# Patient Record
Sex: Male | Born: 1945
Health system: Southern US, Community
[De-identification: ages and names within clinical notes are randomized; demographics above are authoritative.]

## PROBLEM LIST (undated history)

## (undated) DIAGNOSIS — F1020 Alcohol dependence, uncomplicated: Secondary | ICD-10-CM

## (undated) DIAGNOSIS — M1990 Unspecified osteoarthritis, unspecified site: Secondary | ICD-10-CM

## (undated) DIAGNOSIS — Z5189 Encounter for other specified aftercare: Secondary | ICD-10-CM

## (undated) DIAGNOSIS — IMO0001 Reserved for inherently not codable concepts without codable children: Secondary | ICD-10-CM

## (undated) DIAGNOSIS — R011 Cardiac murmur, unspecified: Secondary | ICD-10-CM

## (undated) DIAGNOSIS — E785 Hyperlipidemia, unspecified: Secondary | ICD-10-CM

## (undated) DIAGNOSIS — G709 Myoneural disorder, unspecified: Secondary | ICD-10-CM

## (undated) DIAGNOSIS — T7840XA Allergy, unspecified, initial encounter: Secondary | ICD-10-CM

## (undated) DIAGNOSIS — K219 Gastro-esophageal reflux disease without esophagitis: Secondary | ICD-10-CM

## (undated) DIAGNOSIS — K269 Duodenal ulcer, unspecified as acute or chronic, without hemorrhage or perforation: Secondary | ICD-10-CM

## (undated) DIAGNOSIS — K5730 Diverticulosis of large intestine without perforation or abscess without bleeding: Secondary | ICD-10-CM

## (undated) HISTORY — DX: Allergy, unspecified, initial encounter: T78.40XA

## (undated) HISTORY — DX: Unspecified osteoarthritis, unspecified site: M19.90

## (undated) HISTORY — DX: Hyperlipidemia, unspecified: E78.5

## (undated) HISTORY — DX: Encounter for other specified aftercare: Z51.89

## (undated) HISTORY — PX: VASECTOMY: SHX75

## (undated) HISTORY — PX: SMALL INTESTINE SURGERY: SHX150

## (undated) HISTORY — DX: Gastro-esophageal reflux disease without esophagitis: K21.9

## (undated) HISTORY — PX: TONSILLECTOMY: SUR1361

## (undated) HISTORY — DX: Cardiac murmur, unspecified: R01.1

## (undated) HISTORY — DX: Myoneural disorder, unspecified: G70.9

## (undated) HISTORY — DX: Duodenal ulcer, unspecified as acute or chronic, without hemorrhage or perforation: K26.9

## (undated) HISTORY — PX: UPPER GASTROINTESTINAL ENDOSCOPY: SHX188

## (undated) HISTORY — PX: HERNIA REPAIR: SHX51

---

## 2005-04-14 DIAGNOSIS — K5730 Diverticulosis of large intestine without perforation or abscess without bleeding: Secondary | ICD-10-CM

## 2005-04-14 HISTORY — PX: COLONOSCOPY: SHX174

## 2005-04-14 HISTORY — DX: Diverticulosis of large intestine without perforation or abscess without bleeding: K57.30

## 2006-01-27 ENCOUNTER — Ambulatory Visit: Payer: Self-pay | Admitting: Gastroenterology

## 2006-02-06 ENCOUNTER — Ambulatory Visit: Payer: Self-pay | Admitting: Gastroenterology

## 2012-02-24 ENCOUNTER — Ambulatory Visit (INDEPENDENT_AMBULATORY_CARE_PROVIDER_SITE_OTHER): Payer: Medicare Other | Admitting: Family Medicine

## 2012-02-24 DIAGNOSIS — Z23 Encounter for immunization: Secondary | ICD-10-CM

## 2012-02-24 NOTE — Progress Notes (Signed)
  Subjective:    Patient ID: Christopher Hendricks, male    DOB: Jan 26, 1946, 66 y.o.   MRN: [id number]  HPI    Review of Systems     Objective:   Physical Exam Flu shot only       Assessment & Plan:

## 2012-08-05 ENCOUNTER — Encounter: Payer: Medicare Other | Admitting: Family Medicine

## 2012-08-17 ENCOUNTER — Encounter (HOSPITAL_COMMUNITY): Payer: Self-pay | Admitting: *Deleted

## 2012-08-17 ENCOUNTER — Inpatient Hospital Stay (HOSPITAL_COMMUNITY)
Admission: EM | Admit: 2012-08-17 | Discharge: 2012-08-20 | DRG: 378 | Disposition: A | Discharge status: Home / Self Care | Payer: Medicare Other | Attending: Internal Medicine | Admitting: Internal Medicine

## 2012-08-17 ENCOUNTER — Ambulatory Visit (INDEPENDENT_AMBULATORY_CARE_PROVIDER_SITE_OTHER): Payer: Medicare Other | Admitting: Family Medicine

## 2012-08-17 VITALS — BP 110/80 | HR 105 | Temp 98.3°F | Resp 16 | Ht 71.0 in | Wt 154.0 lb

## 2012-08-17 DIAGNOSIS — F1020 Alcohol dependence, uncomplicated: Secondary | ICD-10-CM | POA: Diagnosis present

## 2012-08-17 DIAGNOSIS — D62 Acute posthemorrhagic anemia: Secondary | ICD-10-CM | POA: Diagnosis present

## 2012-08-17 DIAGNOSIS — R7989 Other specified abnormal findings of blood chemistry: Secondary | ICD-10-CM | POA: Diagnosis present

## 2012-08-17 DIAGNOSIS — R55 Syncope and collapse: Secondary | ICD-10-CM

## 2012-08-17 DIAGNOSIS — K2920 Alcoholic gastritis without bleeding: Secondary | ICD-10-CM | POA: Diagnosis present

## 2012-08-17 DIAGNOSIS — K922 Gastrointestinal hemorrhage, unspecified: Secondary | ICD-10-CM | POA: Diagnosis present

## 2012-08-17 DIAGNOSIS — D649 Anemia, unspecified: Secondary | ICD-10-CM

## 2012-08-17 DIAGNOSIS — R0602 Shortness of breath: Secondary | ICD-10-CM

## 2012-08-17 DIAGNOSIS — H539 Unspecified visual disturbance: Secondary | ICD-10-CM

## 2012-08-17 DIAGNOSIS — E860 Dehydration: Secondary | ICD-10-CM

## 2012-08-17 DIAGNOSIS — I951 Orthostatic hypotension: Secondary | ICD-10-CM | POA: Diagnosis present

## 2012-08-17 DIAGNOSIS — Z79899 Other long term (current) drug therapy: Secondary | ICD-10-CM

## 2012-08-17 DIAGNOSIS — Z7982 Long term (current) use of aspirin: Secondary | ICD-10-CM

## 2012-08-17 DIAGNOSIS — R110 Nausea: Secondary | ICD-10-CM

## 2012-08-17 DIAGNOSIS — F1010 Alcohol abuse, uncomplicated: Secondary | ICD-10-CM | POA: Diagnosis present

## 2012-08-17 DIAGNOSIS — K263 Acute duodenal ulcer without hemorrhage or perforation: Secondary | ICD-10-CM | POA: Diagnosis present

## 2012-08-17 DIAGNOSIS — K264 Chronic or unspecified duodenal ulcer with hemorrhage: Principal | ICD-10-CM | POA: Diagnosis present

## 2012-08-17 DIAGNOSIS — Z8719 Personal history of other diseases of the digestive system: Secondary | ICD-10-CM

## 2012-08-17 DIAGNOSIS — R0789 Other chest pain: Secondary | ICD-10-CM

## 2012-08-17 DIAGNOSIS — F10229 Alcohol dependence with intoxication, unspecified: Secondary | ICD-10-CM | POA: Diagnosis present

## 2012-08-17 HISTORY — DX: Reserved for inherently not codable concepts without codable children: IMO0001

## 2012-08-17 HISTORY — DX: Diverticulosis of large intestine without perforation or abscess without bleeding: K57.30

## 2012-08-17 HISTORY — DX: Alcohol dependence, uncomplicated: F10.20

## 2012-08-17 LAB — POCT CBC
HCT, POC: 34 % — AB (ref 43.5–53.7)
Lymph, poc: 1.9 (ref 0.6–3.4)
MCHC: 32.1 g/dL (ref 31.8–35.4)
MPV: 11.4 fL (ref 0–99.8)
POC Granulocyte: 5.7 (ref 2–6.9)
POC LYMPH PERCENT: 23.7 %L (ref 10–50)
POC MID %: 6.5 %M (ref 0–12)
RDW, POC: 13.1 %

## 2012-08-17 LAB — PROTIME-INR
INR: 1.07 (ref 0.00–1.49)
Prothrombin Time: 13.8 seconds (ref 11.6–15.2)

## 2012-08-17 LAB — BASIC METABOLIC PANEL
Calcium: 8.5 mg/dL (ref 8.4–10.5)
GFR calc non Af Amer: 90 mL/min — ABNORMAL LOW (ref >90)
Sodium: 136 mEq/L (ref 135–145)

## 2012-08-17 LAB — POCT I-STAT TROPONIN I

## 2012-08-17 LAB — CBC
Hemoglobin: 8.9 g/dL — ABNORMAL LOW (ref 13.0–17.0)
MCHC: 35.6 g/dL (ref 30.0–36.0)
WBC: 5.3 10*3/uL (ref 4.0–10.5)

## 2012-08-17 LAB — GLUCOSE, CAPILLARY: Glucose-Capillary: 98 mg/dL (ref 70–99)

## 2012-08-17 MED ORDER — THIAMINE HCL 100 MG/ML IJ SOLN
100.0000 mg | Freq: Every day | INTRAMUSCULAR | Status: DC
  Filled 2012-08-17 (×3): qty 1

## 2012-08-17 MED ORDER — BIOTENE DRY MOUTH MT LIQD
15.0000 mL | Freq: Two times a day (BID) | OROMUCOSAL | Status: DC
Start: 2012-08-18 — End: 2012-08-20

## 2012-08-17 MED ORDER — PNEUMOCOCCAL VAC POLYVALENT 25 MCG/0.5ML IJ INJ
0.5000 mL | INJECTION | INTRAMUSCULAR | Status: AC
  Administered 2012-08-18: 0.5 mL via INTRAMUSCULAR
  Filled 2012-08-17: qty 0.5

## 2012-08-17 MED ORDER — OCTREOTIDE LOAD VIA INFUSION: 50.0000 ug | Freq: Once | INTRAVENOUS | Status: DC

## 2012-08-17 MED ORDER — SODIUM CHLORIDE 0.9 % IV SOLN
8.0000 mg/h | INTRAVENOUS | Status: DC
  Administered 2012-08-17 – 2012-08-20 (×4): 8 mg/h via INTRAVENOUS
  Filled 2012-08-17 (×12): qty 80

## 2012-08-17 MED ORDER — ONDANSETRON HCL 4 MG PO TABS: 4.0000 mg | ORAL_TABLET | Freq: Four times a day (QID) | ORAL | Status: DC | PRN

## 2012-08-17 MED ORDER — ACETAMINOPHEN 650 MG RE SUPP: 650.0000 mg | Freq: Four times a day (QID) | RECTAL | Status: DC | PRN

## 2012-08-17 MED ORDER — SODIUM CHLORIDE 0.9 % IV SOLN
INTRAVENOUS | Status: DC
  Administered 2012-08-17 – 2012-08-18 (×2): via INTRAVENOUS

## 2012-08-17 MED ORDER — VITAMIN B-1 100 MG PO TABS
100.0000 mg | ORAL_TABLET | Freq: Every day | ORAL | Status: DC
  Administered 2012-08-18 – 2012-08-20 (×3): 100 mg via ORAL
  Filled 2012-08-17 (×3): qty 1

## 2012-08-17 MED ORDER — PANTOPRAZOLE SODIUM 40 MG IV SOLR
40.0000 mg | Freq: Once | INTRAVENOUS | Status: AC
  Administered 2012-08-17: 40 mg via INTRAVENOUS
  Filled 2012-08-17: qty 40

## 2012-08-17 MED ORDER — LORAZEPAM 1 MG PO TABS: 1.0000 mg | ORAL_TABLET | Freq: Four times a day (QID) | ORAL | Status: DC | PRN

## 2012-08-17 MED ORDER — FOLIC ACID 1 MG PO TABS
1.0000 mg | ORAL_TABLET | Freq: Every day | ORAL | Status: DC
  Administered 2012-08-18 – 2012-08-20 (×3): 1 mg via ORAL
  Filled 2012-08-17 (×3): qty 1

## 2012-08-17 MED ORDER — SODIUM CHLORIDE 0.9 % IJ SOLN
3.0000 mL | Freq: Two times a day (BID) | INTRAMUSCULAR | Status: DC
  Administered 2012-08-17: 3 mL via INTRAVENOUS

## 2012-08-17 MED ORDER — ACETAMINOPHEN 325 MG PO TABS: 650.0000 mg | ORAL_TABLET | Freq: Four times a day (QID) | ORAL | Status: DC | PRN

## 2012-08-17 MED ORDER — CHLORHEXIDINE GLUCONATE 0.12 % MT SOLN
15.0000 mL | Freq: Two times a day (BID) | OROMUCOSAL | Status: DC
  Administered 2012-08-18: 15 mL via OROMUCOSAL
  Filled 2012-08-17 (×8): qty 15

## 2012-08-17 MED ORDER — SODIUM CHLORIDE 0.9 % IV BOLUS (SEPSIS)
1000.0000 mL | Freq: Once | INTRAVENOUS | Status: AC
  Administered 2012-08-17: 1000 mL via INTRAVENOUS

## 2012-08-17 MED ORDER — LORAZEPAM 2 MG/ML IJ SOLN: 0.0000 mg | Freq: Two times a day (BID) | INTRAMUSCULAR | Status: DC

## 2012-08-17 MED ORDER — OCTREOTIDE ACETATE 50 MCG/ML IJ SOLN
50.0000 ug | Freq: Once | INTRAMUSCULAR | Status: AC
  Administered 2012-08-17: 50 ug via SUBCUTANEOUS
  Filled 2012-08-17: qty 1

## 2012-08-17 MED ORDER — ONDANSETRON HCL 4 MG/2ML IJ SOLN: 4.0000 mg | Freq: Four times a day (QID) | INTRAMUSCULAR | Status: DC | PRN

## 2012-08-17 MED ORDER — LORAZEPAM 2 MG/ML IJ SOLN: 1.0000 mg | Freq: Four times a day (QID) | INTRAMUSCULAR | Status: DC | PRN

## 2012-08-17 MED ORDER — ADULT MULTIVITAMIN W/MINERALS CH
1.0000 | ORAL_TABLET | Freq: Every day | ORAL | Status: DC
  Administered 2012-08-18 – 2012-08-20 (×3): 1 via ORAL
  Filled 2012-08-17 (×3): qty 1

## 2012-08-17 MED ORDER — LORAZEPAM 2 MG/ML IJ SOLN: 0.0000 mg | Freq: Four times a day (QID) | INTRAMUSCULAR | Status: AC

## 2012-08-17 NOTE — ED Provider Notes (Signed)
History     CSN: [id number]  Arrival date & time 08/17/12  1729   First MD Initiated Contact with Patient 08/17/12 1738      Chief Complaint  Patient presents with  . Near Syncope     Patient is a 67 y.o. male presenting with weakness. The history is provided by the patient.  Weakness This is a new problem. The current episode started 2 days ago. The problem occurs daily. The problem has been resolved. Associated symptoms include chest pain and shortness of breath. Pertinent negatives include no abdominal pain and no headaches. Exacerbated by: activity. The symptoms are relieved by rest. He has tried rest for the symptoms. The treatment provided moderate relief.  Pt reports two episodes of generalized weakness over the past 2 days He reports today he was walking and felt weak, lightheaded and had to rest.  He reports mild CP and SOB that improves with rest No fever/HA/vomitng.  No abdominal pain.  He did not fall or hit his head He also reports recent dark stool as well He denies known h/o ulcers He reports h/o daily etoh use  History reviewed. No pertinent past medical history.  Past Surgical History  Procedure Laterality Date  . Hernia repair      Family History  Problem Relation Age of Onset  . Diabetes Daughter     History  Substance Use Topics  . Smoking status: Never Smoker   . Smokeless tobacco: Not on file  . Alcohol Use: Yes      Review of Systems  Constitutional: Positive for fatigue. Negative for fever.  Respiratory: Positive for shortness of breath.   Cardiovascular: Positive for chest pain.  Gastrointestinal: Positive for blood in stool. Negative for abdominal pain.  Neurological: Positive for weakness. Negative for headaches.  All other systems reviewed and are negative.    Allergies  Codeine and Sulfa antibiotics  Home Medications   Current Outpatient Rx  Name  Route  Sig  Dispense  Refill  . Biotin 5 MG CAPS   Oral   Take 1 capsule by  mouth every other day.          . Multiple Vitamins-Minerals (MULTIVITAMIN WITH MINERALS) tablet   Oral   Take 1 tablet by mouth daily.         . saw palmetto 160 MG capsule   Oral   Take 320 mg by mouth daily.          . Zinc Sulfate (ZINC 15 PO)   Oral   Take 1 capsule by mouth every other day.            BP 124/70  Pulse 101  Temp(Src) 98.7 F (37.1 C) (Oral)  SpO2 99%  Physical Exam CONSTITUTIONAL: Well developed/well nourished HEAD: Normocephalic/atraumatic EYES: EOMI/PERRL ENMT: Mucous membranes moist NECK: supple no meningeal signs SPINE:entire spine nontender CV: S1/S2 noted, no murmurs/rubs/gallops noted LUNGS: Lungs are clear to auscultation bilaterally, no apparent distress ABDOMEN: soft, nontender, no rebound or guarding GU:no cva tenderness Rectal - stool color black, hemoccult positive (pt refused to have nurse chaperone) NEURO: Pt is awake/alert, moves all extremitiesx4 EXTREMITIES: pulses normal, full ROM SKIN: warm, color normal PSYCH: no abnormalities of mood noted  ED Course  Procedures   Labs Reviewed  BASIC METABOLIC PANEL  TYPE AND SCREEN   Pt with near syncopal episodes He is anemic when compared to prior labs (last HGB around 15 per urgent care) He has melena Will admit Pt agreeable  9:15 PM D/w [facility] dr Toniann Fail will admit for GI bleed   MDM  Nursing notes including past medical history and social history reviewed and considered in documentation Labs/vital reviewed and considered Previous records reviewed and considered    Date: 08/17/2012  Rate: 83  Rhythm: normal sinus rhythm  QRS Axis: normal  Intervals: normal  ST/T Wave abnormalities: nonspecific ST changes  Conduction Disutrbances:none         Joya Gaskins, MD 08/17/12 2116

## 2012-08-17 NOTE — ED Notes (Signed)
Reports had a near syncopal episode yesterday when got up to go to bathroom. States felt some chest pressure, SOB, nausea, dizziness, lighheadedness, generalized weakness which lasted approx few minutes. Pt reports today had another similar episode while walking. Denies all symptoms presently. Pt also reports noticed dark stool just today.

## 2012-08-17 NOTE — Progress Notes (Signed)
Subjective:    Patient ID: Christopher Hendricks, male    DOB: 01/01/1946, 67 y.o.   MRN: [id number]  HPI Christopher Hendricks is a 67 y.o. 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 Felt tired only - but usual with this activity.  Drinks about a liter of wine per night. Early yesterday morning. woke up feeling hot, sweating - too much covers, slight sweaty.  after urinating around 2:30am - vision went yellow, lightheaded, no collapse, but went to hands and knees. Felt a little better after 30 seconds. Not feeling well past few days - generalized discomfort- front off chest and abdomen. Walking to shoe shop today around 11:45am - out of breath, vision went yellow, slight tightness in chest, no radiation to neck, arms - lasted 15-30 seconds. Stopped walking and vision improved, slight nausea, no vomiting and not sweating with symptoms today. Still queasy feeling, but no further chest pain. No PND, no orthopnea. Slight queasy feeling since working this past weekend.   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  Nonsmoker.  Alcohol - about 6-8 glasses wine per day on average.  No recent change in amounts. No hx of DUI, or problems with alcohol, no known hx of withdrawal.   No known hx of HTN.  LDL 108 12/06/09. EKG 12/06/09 - Sr, no acute findings  FH: adopted - unknown FH of heart dz.   Takes 4 full strength aspirin every night.     Review of Systems  Constitutional: Positive for diaphoresis. Negative for fever and chills.  Eyes: Positive for visual disturbance.  Respiratory: Positive for chest tightness and shortness of breath.   Cardiovascular: Positive for chest pain. Negative for palpitations and leg swelling.  Gastrointestinal: Negative for abdominal pain and abdominal distention.  Neurological: Positive for weakness. Negative for dizziness, tremors, syncope, facial asymmetry, speech difficulty and light-headedness (episodes as above. ).        Objective:   Physical Exam  Vitals reviewed. Constitutional: He is oriented to person, place, and time. He appears well-developed and well-nourished. No distress.  HENT:  Head: Normocephalic and atraumatic.  Eyes: EOM are normal. Pupils are equal, round, and reactive to light.  Neck: No JVD present. Carotid bruit is not present.  Cardiovascular: Normal rate, regular rhythm and normal pulses.   No extrasystoles are present. PMI is not displaced.   Murmur heard.  Systolic murmur is present with a grade of 2/6  Hx of rheumatic fever by hx with chronic heart murmur.   Pulmonary/Chest: Effort normal and breath sounds normal. No respiratory distress. He has no wheezes. He has no rales.  Musculoskeletal: He exhibits no edema.  Neurological: He is alert and oriented to person, place, and time. He has normal strength. No sensory deficit. GCS eye subscore is 4. GCS verbal subscore is 5. GCS motor subscore is 6.  Nonfocal.   Skin: Skin is warm and dry. He is not diaphoretic.  Psychiatric: He has a normal mood and affect. His behavior is normal.    EKG: SR, no acute findings or apparent change from 12/06/09.  Results for orders placed in visit on 08/17/12  POCT CBC      Result Value Range   WBC 8.1  4.6 - 10.2 K/uL   Lymph, poc 1.9  0.6 - 3.4   POC LYMPH PERCENT 23.7  10 - 50 %L   MID (cbc) 0.5  0 - 0.9  POC MID % 6.5  0 - 12 %M   POC Granulocyte 5.7  2 - 6.9   Granulocyte percent 69.8  37 - 80 %G   RBC 3.53 (*) 4.69 - 6.13 M/uL   Hemoglobin 10.9 (*) 14.1 - 18.1 g/dL   HCT, POC 78.2 (*) 95.6 - 53.7 %   MCV 96.4  80 - 97 fL   MCH, POC 30.9  27 - 31.2 pg   MCHC 32.1  31.8 - 35.4 g/dL   RDW, POC 21.3     Platelet Count, POC 250  142 - 424 K/uL   MPV 11.4  0 - 99.8 fL       Assessment & Plan:  Christopher Hendricks is a 67 y.o. male Chest tightness - Plan: EKG 12-Lead  SOB (shortness of breath) - Plan: EKG 12-Lead  Nausea alone - Plan: EKG 12-Lead  Vision changes - Plan: EKG  12-Lead  Hx of near syncopal episode early yesterday morning with vision changes,  slight diaphoresis at the time, and general feeling of malaise.  Repeat episode with walking today of dyspnea, anterior chest tightness - resolved in 30 seconds with rest, but persistent generalized malaise, and nausea feeling now. Hx of alcohol use/overuse, but no recent increase. Risk factors for CAD of age, borderline hyperlipidemia on prior labs in 2011.  Unknown family hx as adopted. No acute findings on EKG, but ddx includes unstable angina, or ACS/non Qwave.  IV placed - 20 ga L forearm, NS KVO. , ems called for transport, transfer of care at 1653. Charge nurse advised.    Patient Instructions  We will have you evaluated in the emergency room for your symptoms tonight, and if discharged home - can return here for follow up.  - pt sent by EMS.     After end of visit - CBC results from blood obtained at placement of IV. Anemia noted at HGB 10.9. This is decreased from 15.3 in 12/06/09.  This was called to EDP at 1735 as patient in ER at this point.

## 2012-08-17 NOTE — ED Notes (Signed)
From [facility] - c/o near syncopal episode Monday morning & again today. Associated with CP, SOB, nausea, weakness. Presently denies all symptoms.

## 2012-08-17 NOTE — H&P (Signed)
[facility] History and Physical  AMONI MORALES ZOX:[id number] DOB: 1946-04-02 DOA: 08/17/2012  Referring physician: Dr. Bebe Shaggy. PCP: No primary provider on file. [facility].  Chief Complaint: Dizziness.  HPI: Christopher Hendricks is a 67 y.o. male with no significant past medical history was referred to the ER because patient was complaining of dizziness and near syncopal episodes. Patient had initial episode 2 days ago when he woke up in the morning 2 AM and felt like almost passing out. He was in the bathroom and sat down for some time and went back to the bed. A similar episode today when he was walking uphill. After he took some rest he felt better. Patient also had some exertional shortness of breath. Due to these symptoms he had gone to the urgent care. Over there blood work showed proper hemoglobin of 5 g from previous in their records. Patient also has been noticing dark stools last few days. In the ER patient's rectal exam by the ER physician showed black stools and repeat hemoglobin confirmed be around 10. At this time patient has been admitted for further management. Patient denies any nausea vomiting abdominal pain or diarrhea. Denies any chest pain presently. Patient has been taking aspirin every night. Denies using any NSAIDs otherwise. Has not noticed any frank bleeding. Patient is presently hemodynamically stable. Patient drinks alcohol everyday.  Review of Systems: As presented in the history of presenting illness, rest negative.  Past Medical History  Diagnosis Date  . Medical history non-contributory    Past Surgical History  Procedure Laterality Date  . Hernia repair     Social History:  reports that he has never smoked. He does not have any smokeless tobacco history on file. He reports that  drinks alcohol. He reports that he does not use illicit drugs. Lives at home. where does patient live-- Can do ADLs. Can patient participate in ADLs?  Allergies   Allergen Reactions  . Codeine Nausea And Vomiting  . Sulfa Antibiotics Rash    Family History  Problem Relation Age of Onset  . Diabetes Daughter       Prior to Admission medications   Medication Sig Start Date End Date Taking? Authorizing Provider  Biotin 5 MG CAPS Take 1 capsule by mouth every other day.    Yes Historical Provider, MD  Multiple Vitamins-Minerals (MULTIVITAMIN WITH MINERALS) tablet Take 1 tablet by mouth daily.   Yes Historical Provider, MD  saw palmetto 160 MG capsule Take 320 mg by mouth daily.    Yes Historical Provider, MD  Zinc Sulfate (ZINC 15 PO) Take 1 capsule by mouth every other day.    Yes Historical Provider, MD   Physical Exam: Filed Vitals:   08/17/12 1754 08/17/12 1755 08/17/12 1759 08/17/12 1845  BP: 127/75 120/74 124/70 123/76  Pulse: 78 86 101 80  Temp:      TempSrc:      Resp:    16  SpO2:    100%     General:  Well-developed and nourished.  Eyes: Mild pallor no icterus.  ENT: No discharge from the ears eyes nose and mouth.  Neck: No mass felt.  Cardiovascular: S1-S2 heard.  Respiratory: No rhonchi or crepitations.  Abdomen: Soft nontender bowel sounds present.  Skin: No rash.  Musculoskeletal: No edema.  Psychiatric: Appears normal.  Neurologic: Alert awake oriented to time place and person. Moves all extremities.  Labs on Admission:  Basic Metabolic Panel:  Recent Labs Lab 08/17/12 1800  NA  136  K 3.5  CL 101  CO2 28  GLUCOSE 106*  BUN 28*  CREATININE 0.84  CALCIUM 8.5   Liver Function Tests: No results found for this basename: AST, ALT, ALKPHOS, BILITOT, PROT, ALBUMIN,  in the last 168 hours No results found for this basename: LIPASE, AMYLASE,  in the last 168 hours No results found for this basename: AMMONIA,  in the last 168 hours CBC:  Recent Labs Lab 08/17/12 1727  WBC 8.1  HGB 10.9*  HCT 34.0*  MCV 96.4   Cardiac Enzymes: No results found for this basename: CKTOTAL, CKMB, CKMBINDEX,  TROPONINI,  in the last 168 hours  BNP (last 3 results) No results found for this basename: PROBNP,  in the last 8760 hours CBG: No results found for this basename: GLUCAP,  in the last 168 hours  Radiological Exams on Admission: No results found.  EKG: Independently reviewed. Normal sinus rhythm.  Assessment/Plan Active Problems:   GI bleed   Anemia   Alcoholism   1. GI bleed - suspect upper GI bleed. Since patient drinks alcohol at this time in addition to Protonix infusion we will also place patient on octreotide infusion. Check CBC frequently and transfuse if it falls less than 7 g or if patient becomes hypotensive. Patient will be kept n.p.o. in anticipation of possible EGD. Consult GI. Check INR and LFTs. 2. Blood loss anemia - closely follow CBC. 3. Alcoholism - patient has been placed on alcohol withdrawal protocol.    Code Status: Full code.  Family Communication: Patient's wife at the bedside.  Disposition Plan: Admit to inpatient.    Jerolene Kupfer N. [facility] Pager [phone].  If 7PM-7AM, please contact night-coverage [email or web address] Password [facility] 08/17/2012, 9:36 PM

## 2012-08-17 NOTE — Patient Instructions (Addendum)
We will have you evaluated in the emergency room for your symptoms tonight, and if discharged home - can return here for follow up.  - pt sent by EMS.

## 2012-08-17 NOTE — ED Notes (Signed)
Attempted to call report to floor.  Was told RN would return my call 

## 2012-08-18 ENCOUNTER — Encounter (HOSPITAL_COMMUNITY): Payer: Self-pay | Admitting: Physician Assistant

## 2012-08-18 ENCOUNTER — Encounter (HOSPITAL_COMMUNITY)
Admission: EM | Disposition: A | Discharge status: Home / Self Care | Payer: Self-pay | Source: Home / Self Care | Attending: Internal Medicine

## 2012-08-18 DIAGNOSIS — K264 Chronic or unspecified duodenal ulcer with hemorrhage: Principal | ICD-10-CM

## 2012-08-18 DIAGNOSIS — D62 Acute posthemorrhagic anemia: Secondary | ICD-10-CM

## 2012-08-18 HISTORY — PX: ESOPHAGOGASTRODUODENOSCOPY: SHX5428

## 2012-08-18 LAB — CBC
HCT: 23.3 % — ABNORMAL LOW (ref 39.0–52.0)
Hemoglobin: 8.5 g/dL — ABNORMAL LOW (ref 13.0–17.0)
Hemoglobin: 8.3 g/dL — ABNORMAL LOW (ref 13.0–17.0)
MCHC: 35.9 g/dL (ref 30.0–36.0)
MCV: 89.6 fL (ref 78.0–100.0)
Platelets: 147 10*3/uL — ABNORMAL LOW (ref 150–400)
RBC: 2.7 MIL/uL — ABNORMAL LOW (ref 4.22–5.81)
RDW: 12.6 % (ref 11.5–15.5)
WBC: 5.3 10*3/uL (ref 4.0–10.5)
WBC: 4.7 10*3/uL (ref 4.0–10.5)
WBC: 5 10*3/uL (ref 4.0–10.5)

## 2012-08-18 LAB — COMPREHENSIVE METABOLIC PANEL
Alkaline Phosphatase: 48 U/L (ref 39–117)
BUN: 19 mg/dL (ref 6–23)
Chloride: 106 mEq/L (ref 96–112)
GFR calc Af Amer: >90 mL/min (ref >90)
Glucose, Bld: 107 mg/dL — ABNORMAL HIGH (ref 70–99)
Potassium: 3.7 mEq/L (ref 3.5–5.1)
Total Bilirubin: 0.9 mg/dL (ref 0.3–1.2)

## 2012-08-18 LAB — OCCULT BLOOD, POC DEVICE: Fecal Occult Bld: POSITIVE — AB

## 2012-08-18 SURGERY — EGD (ESOPHAGOGASTRODUODENOSCOPY)
Anesthesia: Moderate Sedation

## 2012-08-18 MED ORDER — MIDAZOLAM HCL 10 MG/2ML IJ SOLN
INTRAMUSCULAR | Status: DC | PRN
  Administered 2012-08-18: 1 mg via INTRAVENOUS
  Administered 2012-08-18 (×2): 2 mg via INTRAVENOUS

## 2012-08-18 MED ORDER — MIDAZOLAM HCL 5 MG/ML IJ SOLN
INTRAMUSCULAR | Status: AC
  Filled 2012-08-18: qty 2

## 2012-08-18 MED ORDER — EPINEPHRINE HCL 0.1 MG/ML IJ SOSY
PREFILLED_SYRINGE | INTRAMUSCULAR | Status: AC
Start: 2012-08-18 — End: 2012-08-18
  Filled 2012-08-18: qty 10

## 2012-08-18 MED ORDER — SODIUM CHLORIDE 0.9 % IJ SOLN
PREFILLED_SYRINGE | INTRAMUSCULAR | Status: DC | PRN
  Administered 2012-08-18: 15:00:00

## 2012-08-18 MED ORDER — DIPHENHYDRAMINE HCL 50 MG/ML IJ SOLN
INTRAMUSCULAR | Status: DC | PRN
  Administered 2012-08-18: 25 mg via INTRAVENOUS

## 2012-08-18 MED ORDER — BUTAMBEN-TETRACAINE-BENZOCAINE 2-2-14 % EX AERO
INHALATION_SPRAY | CUTANEOUS | Status: DC | PRN
  Administered 2012-08-18: 2 via TOPICAL

## 2012-08-18 MED ORDER — FENTANYL CITRATE 0.05 MG/ML IJ SOLN
INTRAMUSCULAR | Status: DC | PRN
  Administered 2012-08-18 (×3): 25 ug via INTRAVENOUS

## 2012-08-18 MED ORDER — DIPHENHYDRAMINE HCL 50 MG/ML IJ SOLN
INTRAMUSCULAR | Status: AC
  Filled 2012-08-18: qty 1

## 2012-08-18 MED ORDER — FENTANYL CITRATE 0.05 MG/ML IJ SOLN
INTRAMUSCULAR | Status: AC
  Filled 2012-08-18: qty 2

## 2012-08-18 NOTE — Progress Notes (Addendum)
[facility] PROGRESS NOTE  ANDY ALLENDE ZOX:[id number] DOB: [birth year] DOA: 08/17/2012 PCP: No primary provider on file.  Christopher Hendricks is a 67 y.o. male with no significant past medical history was referred to the ER because patient was complaining of dizziness and near syncopal episodes. Patient had initial episode 2 days ago when he woke up in the morning 2 AM and felt like almost passing out. He was in the bathroom and sat down for some time and went back to the bed. A similar episode today when he was walking uphill. After he took some rest he felt better. Patient also had some exertional shortness of breath. Due to these symptoms he had gone to the urgent care. Blood work demonstrated a hemoglobin drop of 5 g from previous in their records. Patient also has been noticing dark stools last few days. In the ER patient's rectal exam by the ER physician showed black stools and repeat hemoglobin confirmed be around 10. At this time patient has been admitted for further management. Patient denies any nausea vomiting abdominal pain or diarrhea. Denies any chest pain presently. Patient has been taking aspirin every night. Denies using any NSAIDs otherwise. Has not noticed any frank bleeding. Patient is presently hemodynamically stable. Patient drinks alcohol everyday.   Assessment/Plan: 1. GI Bleed -likely upper GI bleed  -Hgb currently 8.5 (5/7); was 10.9 on arrival to ED (5/6) -Serial CBC to watch Hemoglobin -NPO  -Continue protonix infusion.  Octreotide has completed. [facility] consulted. -Upper Endoscopy scheduled for 12:15pm on 5/7  2. Anemia -Likely from upper GI bleed -Microcytic  -Serial CBC  -Transfuse if Hgb drops below 7 or hypotensive   3. Alcoholism  -1.5 quarts of white wine per day  -Continue folic acid, thiamine  -CIWA protocol  -Monitor for signs of DTs -Social Work consultation  Code Status: Full Family Communication: Communicated with Patient   Disposition Plan: Inpatient    Consultants:  GI  Procedures:  Upper endoscopy at 12:15pm on 08/18/12  Antibiotics:  None   HPI/Subjective: Patient ranks "burning" sensation in abdomen 2/10. Denies nausea, vomiting, diarrhea. NPO until endoscopy procedure.   Objective: Filed Vitals:   08/17/12 2100 08/17/12 2145 08/17/12 2227 08/18/12 0509  BP: 117/74 115/72 131/74 105/66  Pulse: 82 75 77 74  Temp:   98.6 F (37 C) 98.6 F (37 C)  TempSrc:   Oral Oral  Resp: 20 15 16 16   Height:   5\' 11"  (1.803 m)   Weight:   71.26 kg (157 lb 1.6 oz)   SpO2: 99% 99% 98% 99%    Intake/Output Summary (Last 24 hours) at 08/18/12 1055 Last data filed at 08/18/12 8119  Gross per 24 hour  Intake   6.33 ml  Output      0 ml  Net   6.33 ml   Filed Weights   08/17/12 2227  Weight: 71.26 kg (157 lb 1.6 oz)    Exam:   General:  WDWN male appears to be resting comfortably in no acute distress  HEENT: normocephalic, atraumatic, PERLA, sclera/conjunctiva clear, moist mucous membranes, neck supple, no JVD   Cardiovascular: RRR, S1/S2 appreciated, no murmurs/rubs/gallops  Respiratory: good air entry bilaterally, clear to auscultation bilaterally, no rales/rhonchi/wheezes   Abdomen: normal bowel sounds, no masses, soft non distended, non tender to palpation   Musculoskeletal: moves all limbs spontaneously, no cyanosis, clubbing, or edema   Neurological: alert and oriented x3, cranial nerves 2-12 grossly intact, no focal deficits, normal affect  Data Reviewed: Basic Metabolic Panel:  Recent Labs Lab 08/17/12 1800 08/18/12 0609  NA 136 138  K 3.5 3.7  CL 101 106  CO2 28 28  GLUCOSE 106* 107*  BUN 28* 19  CREATININE 0.84 0.82  CALCIUM 8.5 8.1*   Liver Function Tests:  Recent Labs Lab 08/18/12 0609  AST 17  ALT 17  ALKPHOS 48  BILITOT 0.9  PROT 4.9*  ALBUMIN 2.9*   CBC:  Recent Labs Lab 08/17/12 2242 08/18/12 0224 08/18/12 0609  WBC 5.3 5.3 4.7  HGB 8.9*  8.7* 8.5*  HCT 25.0* 24.2* 23.3*  MCV 89.9 89.6 88.9  PLT 161 147* 143*   CBG:  Recent Labs Lab 08/17/12 2324 08/18/12 0543  GLUCAP 98 101*      Studies: No results found.  Scheduled Meds: . antiseptic oral rinse  15 mL Mouth Rinse q12n4p  . chlorhexidine  15 mL Mouth Rinse BID  . folic acid  1 mg Oral Daily  . LORazepam  0-4 mg Intravenous Q6H   Followed by  . [START ON 08/19/2012] LORazepam  0-4 mg Intravenous Q12H  . multivitamin with minerals  1 tablet Oral Daily  . pneumococcal 23 valent vaccine  0.5 mL Intramuscular Tomorrow-1000  . sodium chloride  3 mL Intravenous Q12H  . thiamine  100 mg Oral Daily   Or  . thiamine  100 mg Intravenous Daily   Continuous Infusions: . sodium chloride 20 mL/hr at 08/17/12 2241  . pantoprozole (PROTONIX) infusion 8 mg/hr (08/17/12 2315)    Active Problems:   GI bleed   Anemia   Alcoholism  Algis Downs, PA-C Rudolpho Sevin PA-S [facility] If 7PM-7AM, please contact night-coverage at [email or web address], password [facility] 08/18/2012, 10:55 AM  LOS: 1 day    Attending -Patient seen and examined, agree with the assessment and plan as outlined above. UGI bleed-Hb down a bit-but hemodynamically stable. EGD today. Continue with current care  S Ghimire

## 2012-08-18 NOTE — Progress Notes (Signed)
Patient admitted to 5504 from ED. Patient lives at home with wife. Patient is A&Ox3. Patient had two dizziness episodes at home. Patient placed on telemetry running NSR. Patient's skin is WNL. Patient oriented to room and unit. Explained to patient to call for assistance before getting up, pt stated understanding. Placed patient on bedalarm. Will continue to monitor patient. Nelda Marseille, RN

## 2012-08-18 NOTE — Op Note (Signed)
[facility] [address] [place], [address]   ENDOSCOPY PROCEDURE REPORT  PATIENT: Christopher, Hendricks  MR#: [id number] BIRTHDATE: 02/19/1946 , 66  yrs. old GENDER: Male ENDOSCOPIST: Roxy Cedar, MD REFERRED BY:  [facility] PROCEDURE DATE:  08/18/2012 PROCEDURE:  EGD w/ control of bleeding and EGD w/ biopsy ASA CLASS:     Class II INDICATIONS:  Melena. MEDICATIONS: Fentanyl 75 mcg IV, Versed 5 mg IV, Benadryl 25 mg IV, and Epinephrine 1:10,000   - 3cc TOPICAL ANESTHETIC: Cetacaine Spray  DESCRIPTION OF PROCEDURE: After the risks benefits and alternatives of the procedure were thoroughly explained, informed consent was obtained.  The Pentax Gastroscope [id number] endoscope was introduced through the mouth and advanced to the third portion of the duodenum. Without limitations.  The instrument was slowly withdrawn as the mucosa was fully examined.      EXAM:The esophagus was normal.  The stomach was normal.  The duodenum revealed deformity of the distal bulb/D2 junction.  There was a 1 cm clean-based ulcer in the distal bulb.  As well, a 1 cm ulcer at the junction.  The second ulcer submitted oozing without visible vessel.  This was injected with 3 cc of epinephrine.CLO biopsy taken.  Retroflexed views revealed no abnormalities.     The scope was then withdrawn from the patient and the procedure completed.  COMPLICATIONS: There were no complications. ENDOSCOPIC IMPRESSION: 1. Duodenal ulcers with bleeding. Status post endoscopic hemostatic therapy 2. Otherwise normal exam  RECOMMENDATIONS: 1.  Avoid NSAIDS (was using chronic aspirin) 2.  Continue IV PPI drip 3.  Rx CLO if positive 4. Clear liquids only. Monitor hemoglobin and stools. Transfusion threshold 8 g hemoglobin.  REPEAT EXAM:  eSigned:  Roxy Cedar, MD 08/18/2012 2:47 PM  XB:[id number] Russella Dar, MD, Janace Hoard, MD, and The Patient

## 2012-08-18 NOTE — Consult Note (Signed)
[facility] Consult: 9:54 AM 08/18/2012   Referring Provider:  Toniann Fail  Primary Care Physician:  Brayton Mars primary/urgent care:  Dr Janace Hoard Primary Gastroenterologist:  Dr. Russella Dar:  Screening colonoscopy in 2007   Reason for Consultation:  GI  Bleed with dark, FOB+ stool  HPI: Christopher Hendricks is a 67 y.o. male.  Had sigmoid diverticulosis on screening colonoscopy 2007.  S/P herniorrhaphy. Drinks a lot of wine.   2 days hx of pre-syncopal episodes.  Initial episode Monday at 2 AM in the bathroom after urinating, yesterday (Tuesday) while walking up a hill. Both episodes resolved with rest.  Stools were loose and dark on Monday, dark and formed on Tuesday but only one stool each day.  Went to urgent care where Hgb 10.9 was down 5 grams from previous.  Hgb has drifted to 8.5 today.  Today he is not dizzy when he moves from bed to bathroom.  Started on Protonix infusion. No transfusions to date.  No hx of GI bleed.  No stomach problems.  Never nauseated or vomiting. Drinks 5 liters white wine in 5 days for "decades".  Takes four 325 mg ASA each bedtime to prevent hangover/headache.   Denies conflict at job or home due to alcohol consumption.  No stints in rehab.     Past Medical History  Diagnosis Date  . Sigmoid diverticulosis 2007  . Alcoholism /alcohol abuse     Past Surgical History  Procedure Laterality Date  . Hernia repair    . Colonoscopy  2007    sigmoid diverticulosis.  Dr Russella Dar    Prior to Admission medications   Medication Sig Start Date End Date Taking? Authorizing Provider  Biotin 5 MG CAPS Take 1 capsule by mouth every other day.    Yes Historical Provider, MD  Multiple Vitamins-Minerals (MULTIVITAMIN WITH MINERALS) tablet Take 1 tablet by mouth daily.   Yes Historical Provider, MD  saw palmetto 160 MG capsule Take 320 mg by mouth daily.    Yes Historical Provider, MD  Zinc Sulfate (ZINC 15 PO) Take 1 capsule by  mouth every other day.    Yes Historical Provider, MD    Scheduled Meds: . antiseptic oral rinse  15 mL Mouth Rinse q12n4p  . chlorhexidine  15 mL Mouth Rinse BID  . folic acid  1 mg Oral Daily  . LORazepam  0-4 mg Intravenous Q6H   Followed by  . [START ON 08/19/2012] LORazepam  0-4 mg Intravenous Q12H  . multivitamin with minerals  1 tablet Oral Daily  . pneumococcal 23 valent vaccine  0.5 mL Intramuscular Tomorrow-1000  . sodium chloride  3 mL Intravenous Q12H  . thiamine  100 mg Oral Daily   Or  . thiamine  100 mg Intravenous Daily   Infusions: . sodium chloride 20 mL/hr at 08/17/12 2241  . pantoprozole (PROTONIX) infusion 8 mg/hr (08/17/12 2315)   PRN Meds: acetaminophen, acetaminophen, LORazepam, LORazepam, ondansetron (ZOFRAN) IV, ondansetron   Allergies as of 08/17/2012 - Review Complete 08/17/2012  Allergen Reaction Noted  . Codeine Nausea And Vomiting 08/17/2012  . Sulfa antibiotics Rash 08/17/2012    Family History  Problem Relation Age of Onset  . Diabetes Daughter     pt adopted as infant.   History   Social History  . Marital Status: Married    Spouse Name: N/A    Number of Children: N/A  . Years of Education: N/A   Occupational History  . CPA    Social History Main Topics  .  Smoking status: Never Smoker   . Smokeless tobacco: Not on file  . Alcohol Use: Yes     Comment: 1 quart of day  . Drug Use: No  . Sexually Active: Yes   Social History Narrative  . Served in the [employer].     REVIEW OF SYSTEMS: Constitutional:  No weight loss ENT:  No nose bleeds Pulm:  No SOB or cough CV:  No chest pain, no palpitations, no pedal edema GU:  No  GI:  No dysphagia, no diarrhea/constipation.  No dyspepsia.  No hx liver disease Heme:  No hx low blood counts.    Transfusions:  none Neuro:  No head ache, no double vision.  2 presyncopal episodes per HPI Derm:  No rash or sores.  No itching.  No jaundice Endocrine:  No sweats or chills.    Immunization:  Did not query   PHYSICAL EXAM: Vital signs in last 24 hours: Temp:  [98.3 F (36.8 C)-98.7 F (37.1 C)] 98.6 F (37 C) (05/07 0509) Pulse Rate:  [74-105] 74 (05/07 0509) Resp:  [14-20] 16 (05/07 0509) BP: (105-131)/(66-80) 105/66 mmHg (05/07 0509) SpO2:  [98 %-100 %] 99 % (05/07 0509) Weight:  [69.854 kg (154 lb)-71.26 kg (157 lb 1.6 oz)] 71.26 kg (157 lb 1.6 oz) (05/06 2227)  General: somewhat pale, thin, but well appearing WM.  NAD Head:  No asymmetry or facial edema  Eyes:  No icterus, no pallor Ears:  Not HOH  Nose:  No congestion, no sneezing, no drainage Mouth:  Clear, moist, good dentition.  Neck:  No mass, no bruits, no TMG Lungs:  Clear B.  No labored breathing or cough Heart: RRR.  No MRG Abdomen:  Soft, NT, ND.  No HSM, no bruits.  .   Rectal: deferred, FOB + in ED  Musc/Skeltl: no joint swelling or deformity Extremities:  No pedal edema.   Neurologic:  No tremor, no limb weakness,  No confusion. Skin:  No rash, no sores Tattoos:  none Nodes:  No inguinal or cervical adenopathy.    Psych:  Cooperative, not anxious or depressed.   Intake/Output from previous day: 05/06 0701 - 05/07 0700 In: 6.3 [I.V.:6.3] Out: -  Intake/Output this shift:    LAB RESULTS:  Recent Labs  08/17/12 2242 08/18/12 0224 08/18/12 0609  WBC 5.3 5.3 4.7  HGB 8.9* 8.7* 8.5*  HCT 25.0* 24.2* 23.3*  PLT 161 147* 143*   BMET Lab Results  Component Value Date   NA 138 08/18/2012   NA 136 08/17/2012   K 3.7 08/18/2012   K 3.5 08/17/2012   CL 106 08/18/2012   CL 101 08/17/2012   CO2 28 08/18/2012   CO2 28 08/17/2012   GLUCOSE 107* 08/18/2012   GLUCOSE 106* 08/17/2012   BUN 19 08/18/2012   BUN 28* 08/17/2012   CREATININE 0.82 08/18/2012   CREATININE 0.84 08/17/2012   CALCIUM 8.1* 08/18/2012   CALCIUM 8.5 08/17/2012   LFT  Recent Labs  08/18/12 0609  PROT 4.9*  ALBUMIN 2.9*  AST 17  ALT 17  ALKPHOS 48  BILITOT 0.9   PT/INR Lab Results  Component Value Date   INR 1.07  08/17/2012   Drugs of Abuse  No results found for this basename: labopia,  cocainscrnur,  labbenz,  amphetmu,  thcu,  labbarb     RADIOLOGY STUDIES: No results found.  ENDOSCOPIC STUDIES: 2007 Colonoscopy.    Dr Russella Dar Screening study found sigmoid diverticulosis.    IMPRESSION: *  Upper GI bleed with Melena.  Suspect ETOH gastritis =/- ASA induced ulcers.  *  ABL anemia, symptomatic with episodes of presyncope.  *  Azotemia secondary to GI bleed, resolved *  Mild, non-critical thrombocytopenia *  ETOH abuse. No clear signs of dependence.   PLAN: *  EGD this mid day. Orders submitted.  Procedure explained to pt along with risks, he is agreeable to proceed.  *   Leave the PPI drip in place, but revisit necessity of this med after EGD.  *  ETOH abstinence.    LOS: 1 day   Jennye Moccasin  08/18/2012, 9:54 AM Pager: [phone]  GI ATTENDING  History, laboratories, prior colonoscopy report reviewed. Patient personally seen and examined. Agree with history, physical, assessment/plan as outlined above. Patient presents with upper GI bleeding, likely non-variceal. Associated presyncope. Significant change in hemoglobin from baseline. Significant alcohol use. Does use aspirin. Suspect ulcer disease. Plan urgent endoscopy today to further assess. Agree with resuscitation efforts as are ongoing.The nature of the procedure, as well as the risks, benefits, and alternatives were carefully and thoroughly reviewed with the patient. Ample time for discussion and questions allowed. The patient understood, was satisfied, and agreed to proceed.  Wilhemina Bonito. Eda Keys., M.D. [facility] Division of Gastroenterology

## 2012-08-19 ENCOUNTER — Encounter (HOSPITAL_COMMUNITY): Payer: Self-pay | Admitting: Internal Medicine

## 2012-08-19 LAB — CBC
HCT: 26.8 % — ABNORMAL LOW (ref 39.0–52.0)
MCV: 89.3 fL (ref 78.0–100.0)
Platelets: 145 10*3/uL — ABNORMAL LOW (ref 150–400)
Platelets: 173 10*3/uL (ref 150–400)
Platelets: 155 10*3/uL (ref 150–400)
RBC: 2.61 MIL/uL — ABNORMAL LOW (ref 4.22–5.81)
RBC: 3 MIL/uL — ABNORMAL LOW (ref 4.22–5.81)
RDW: 12.6 % (ref 11.5–15.5)
RDW: 14.4 % (ref 11.5–15.5)
WBC: 4.8 10*3/uL (ref 4.0–10.5)
WBC: 5 10*3/uL (ref 4.0–10.5)
WBC: 5.1 10*3/uL (ref 4.0–10.5)

## 2012-08-19 LAB — CLOTEST (H. PYLORI), BIOPSY: Helicobacter screen: NEGATIVE

## 2012-08-19 LAB — GLUCOSE, CAPILLARY
Glucose-Capillary: 108 mg/dL — ABNORMAL HIGH (ref 70–99)
Glucose-Capillary: 89 mg/dL (ref 70–99)
Glucose-Capillary: 91 mg/dL (ref 70–99)
Glucose-Capillary: 93 mg/dL (ref 70–99)
Glucose-Capillary: 97 mg/dL (ref 70–99)

## 2012-08-19 LAB — H. PYLORI ANTIBODY, IGG: H Pylori IgG: <0.4 {ISR}

## 2012-08-19 LAB — BASIC METABOLIC PANEL
Calcium: 8.4 mg/dL (ref 8.4–10.5)
Chloride: 107 mEq/L (ref 96–112)
Creatinine, Ser: 0.87 mg/dL (ref 0.50–1.35)
GFR calc Af Amer: >90 mL/min (ref >90)
Sodium: 141 mEq/L (ref 135–145)

## 2012-08-19 LAB — PREPARE RBC (CROSSMATCH)

## 2012-08-19 MED ORDER — ACETAMINOPHEN 325 MG PO TABS
650.0000 mg | ORAL_TABLET | Freq: Once | ORAL | Status: AC
  Administered 2012-08-19: 650 mg via ORAL
  Filled 2012-08-19: qty 2

## 2012-08-19 MED ORDER — DIPHENHYDRAMINE HCL 50 MG/ML IJ SOLN
25.0000 mg | Freq: Once | INTRAMUSCULAR | Status: AC
  Administered 2012-08-19: 25 mg via INTRAVENOUS
  Filled 2012-08-19: qty 1

## 2012-08-19 MED ORDER — FUROSEMIDE 10 MG/ML IJ SOLN
20.0000 mg | Freq: Once | INTRAMUSCULAR | Status: AC
  Administered 2012-08-19: 20 mg via INTRAVENOUS

## 2012-08-19 NOTE — Progress Notes (Signed)
PATIENT DETAILS Name: Christopher Hendricks Age: 67 y.o. Sex: male Date of Birth: [birth year] Admit Date: 08/17/2012 Admitting Physician Eduard Clos, MD PCP:No primary provider on file.  Subjective: Last BM yesterday-was black-none today  Assessment/Plan: Active Problems: Upper GI Bleed -UGIB secondary to active bleeding DU's seen on EGD 5/7 s/p hemostasis. -H Pylori studies pending -bleeding seems to have stopped  -On Protonix infusion-change to BID dosing as per GI MD -monitor for atleast 48 hours post EGD-patient anxious to go home-but needs further inpatient monitoring. I have explained this to the patient  Anemia -acute blood loss from above -Hb down to 7.8 today-will transfuse 2 units PRBC  Dizziness/Pre-syncopal episodes -suspect orthostatic hypotensive from GI bleed -seems to have resolved-patient ambulating in the room  ETOH Use -no signs of withdrawal --Continue folic acid, thiamine and Ativan per CIWA protocol  Disposition: Remain inpatient  DVT Prophylaxis:  SCD's  Code Status: Full code  Family Communication None at bedside  Procedures:  EGD-5/7  CONSULTS:  GI   MEDICATIONS: Scheduled Meds: . antiseptic oral rinse  15 mL Mouth Rinse q12n4p  . chlorhexidine  15 mL Mouth Rinse BID  . folic acid  1 mg Oral Daily  . furosemide  20 mg Intravenous Once  . LORazepam  0-4 mg Intravenous Q6H   Followed by  . LORazepam  0-4 mg Intravenous Q12H  . multivitamin with minerals  1 tablet Oral Daily  . sodium chloride  3 mL Intravenous Q12H  . thiamine  100 mg Oral Daily   Or  . thiamine  100 mg Intravenous Daily   Continuous Infusions: . sodium chloride 10 mL/hr at 08/18/12 2034  . pantoprozole (PROTONIX) infusion 8 mg/hr (08/19/12 0709)   PRN Meds:.acetaminophen, acetaminophen, LORazepam, LORazepam, ondansetron (ZOFRAN) IV, ondansetron  Antibiotics: Anti-infectives   None       PHYSICAL EXAM: Vital signs in last 24 hours: Filed  Vitals:   08/18/12 2055 08/19/12 0620 08/19/12 0830 08/19/12 0915  BP: 110/72 103/67 117/81 106/65  Pulse: 73 89 103 81  Temp: 98.4 F (36.9 C) 98.7 F (37.1 C) 97.4 F (36.3 C) 98.6 F (37 C)  TempSrc: Oral Oral Oral Oral  Resp: 16 16 18 20   Height:      Weight:      SpO2: 95% 96%      Weight change:  Filed Weights   08/17/12 2227  Weight: 71.26 kg (157 lb 1.6 oz)   Body mass index is 21.92 kg/(m^2).   Gen Exam: Awake and alert with clear speech.   Neck: Supple, No JVD.   Chest: B/L Clear.   CVS: S1 S2 Regular, no murmurs.  Abdomen: soft, BS +, non tender, non distended.  Extremities: no edema, lower extremities warm to touch. Neurologic: Non Focal.   Skin: No Rash.   Wounds: N/A.    Intake/Output from previous day:  Intake/Output Summary (Last 24 hours) at 08/19/12 1012 Last data filed at 08/18/12 1831  Gross per 24 hour  Intake    722 ml  Output      0 ml  Net    722 ml     LAB RESULTS: CBC  Recent Labs Lab 08/18/12 0224 08/18/12 0609 08/18/12 1847 08/19/12 0430 08/19/12 0900  WBC 5.3 4.7 5.0 4.8 5.0  HGB 8.7* 8.5* 8.3* 7.8* 8.5*  HCT 24.2* 23.3* 23.1* 21.5* 23.5*  PLT 147* 143* 150 145* 173  MCV 89.6 88.9 90.2 90.3 90.0  MCH 32.2 32.4 32.4 32.8 32.6  MCHC  36.0 36.5* 35.9 36.3* 36.2*  RDW 12.4 12.4 12.6 12.6 12.4    Chemistries   Recent Labs Lab 08/17/12 1800 08/18/12 0609 08/19/12 0430  NA 136 138 141  K 3.5 3.7 3.8  CL 101 106 107  CO2 28 28 27   GLUCOSE 106* 107* 98  BUN 28* 19 17  CREATININE 0.84 0.82 0.87  CALCIUM 8.5 8.1* 8.4    CBG:  Recent Labs Lab 08/17/12 2324 08/18/12 0543 08/18/12 1651 08/18/12 2339 08/19/12 0623  GLUCAP 98 101* 73 89 93    GFR Estimated Creatinine Clearance: 85.4 ml/min (by C-G formula based on Cr of 0.87).  Coagulation profile  Recent Labs Lab 08/17/12 2242  INR 1.07    Cardiac Enzymes No results found for this basename: CK, CKMB, TROPONINI, MYOGLOBIN,  in the last 168 hours  No  components found with this basename: POCBNP,  No results found for this basename: DDIMER,  in the last 72 hours No results found for this basename: HGBA1C,  in the last 72 hours No results found for this basename: CHOL, HDL, LDLCALC, TRIG, CHOLHDL, LDLDIRECT,  in the last 72 hours No results found for this basename: TSH, T4TOTAL, FREET3, T3FREE, THYROIDAB,  in the last 72 hours No results found for this basename: VITAMINB12, FOLATE, FERRITIN, TIBC, IRON, RETICCTPCT,  in the last 72 hours No results found for this basename: LIPASE, AMYLASE,  in the last 72 hours  Urine Studies No results found for this basename: UACOL, UAPR, USPG, UPH, UTP, UGL, UKET, UBIL, UHGB, UNIT, UROB, ULEU, UEPI, UWBC, URBC, UBAC, CAST, CRYS, UCOM, BILUA,  in the last 72 hours  MICROBIOLOGY: No results found for this or any previous visit (from the past 240 hour(s)).  RADIOLOGY STUDIES/RESULTS: No results found.  Jeoffrey Massed, MD  [facility] Pager:[phone]  If 7PM-7AM, please contact night-coverage [email or web address] Password [email or web address] 08/19/2012, 10:12 AM   LOS: 2 days

## 2012-08-19 NOTE — Progress Notes (Signed)
[facility] Progress Note  Subjective:  Anxious to go home.  No BM since yesterday AM.  Hgb was down slightly today so he is receiving two units of PRBC's.  Objective:  Vital signs in last 24 hours: Temp:  [97.4 F (36.3 C)-98.7 F (37.1 C)] 97.4 F (36.3 C) (05/08 0830) Pulse Rate:  [73-103] 103 (05/08 0830) Resp:  [10-20] 18 (05/08 0830) BP: (103-142)/(65-88) 117/81 mmHg (05/08 0830) SpO2:  [91 %-100 %] 96 % (05/08 0620) Last BM Date: 08/18/12 General:   Alert, Well-developed, in NAD Heart:  Slightly tachy but regular rhythm; no murmurs Pulm:  CTAB.  No W/R/R. Abdomen:  Soft, nontender and nondistended. Normal bowel sounds, without guarding, and without rebound.   Extremities:  Without edema. Neurologic:  Alert and  oriented x4;  grossly normal neurologically. Psych:  Alert and cooperative. Normal mood and affect.  Intake/Output from previous day: 05/07 0701 - 05/08 0700 In: 722 [P.O.:722] Out: -   Lab Results:  Recent Labs  08/18/12 0609 08/18/12 1847 08/19/12 0430  WBC 4.7 5.0 4.8  HGB 8.5* 8.3* 7.8*  HCT 23.3* 23.1* 21.5*  PLT 143* 150 145*   BMET  Recent Labs  08/17/12 1800 08/18/12 0609 08/19/12 0430  NA 136 138 141  K 3.5 3.7 3.8  CL 101 106 107  CO2 28 28 27   GLUCOSE 106* 107* 98  BUN 28* 19 17  CREATININE 0.84 0.82 0.87  CALCIUM 8.5 8.1* 8.4   LFT  Recent Labs  08/18/12 0609  PROT 4.9*  ALBUMIN 2.9*  AST 17  ALT 17  ALKPHOS 48  BILITOT 0.9   PT/INR  Recent Labs  08/17/12 2242  LABPROT 13.8  INR 1.07   Assessment / Plan: -UGIB secondary to active bleeding DU's seen on EGD 5/7 s/p hemostasis. -ABLA, symptomatic with episodes of presyncope prior to admission.  Hgb still drifting down.  Going to receive 2 units of PRBC's this AM. -Azotemia secondary to GI bleed, resolved.  -ETOH abuse. No clear signs of dependence.  *Avoid NSAID's and ASA. *Agree with transfusion to keep Hgb at 8 grams; continue to  monitor. *Continue PPI gtt for now until Dr. Marina Goodell sees the patient.  If he is agreeable then can switch to BID. *Full liquid diet for now until Dr. Marina Goodell clears him for regular diet. *If H. pylori positive then will treat.  Studies pending. *ETOH abstinence.     LOS: 2 days   ZEHR, JESSICA D.  08/19/2012, 9:05 AM  Pager number [phone]   GI ATTENDING  Patient seen and examined. Laboratories reviewed. Agree with interval history and physical as outlined above.. No further GI bleeding. Helicobacter pylori status pending. Advance diet. Continue PPI. Avoid aspirin and NSAIDs. Anticipate discharge tomorrow if no interval problems. We will arrange GI followup.  Wilhemina Bonito. Eda Keys., M.D. [facility] Division of Gastroenterology

## 2012-08-20 ENCOUNTER — Encounter: Payer: Self-pay | Admitting: Gastroenterology

## 2012-08-20 LAB — TYPE AND SCREEN
ABO/RH(D): A POS
Antibody Screen: NEGATIVE
Unit division: 0

## 2012-08-20 LAB — GLUCOSE, CAPILLARY: Glucose-Capillary: 93 mg/dL (ref 70–99)

## 2012-08-20 LAB — CBC
MCH: 31.6 pg (ref 26.0–34.0)
Platelets: 152 10*3/uL (ref 150–400)
RBC: 2.66 MIL/uL — ABNORMAL LOW (ref 4.22–5.81)

## 2012-08-20 MED ORDER — PANTOPRAZOLE SODIUM 40 MG PO TBEC
40.0000 mg | DELAYED_RELEASE_TABLET | Freq: Two times a day (BID) | ORAL | Status: DC
Start: 2012-08-20 — End: 2015-03-01

## 2012-08-20 MED ORDER — PANTOPRAZOLE SODIUM 40 MG PO TBEC
40.0000 mg | DELAYED_RELEASE_TABLET | Freq: Two times a day (BID) | ORAL | Status: DC
  Administered 2012-08-20: 40 mg via ORAL
  Filled 2012-08-20: qty 1

## 2012-08-20 NOTE — Care Management Note (Addendum)
    Page 1 of 1   08/20/2012     3:04:28 PM   CARE MANAGEMENT NOTE 08/20/2012  Patient:  Christopher Hendricks, Christopher Hendricks   Account Number:  [id number]  Date Initiated:  08/20/2012  Documentation initiated by:  Letha Cape  Subjective/Objective Assessment:   dx gib,  admit - lives with spouse. pta indep.     Action/Plan:   Anticipated DC Date:  08/20/2012   Anticipated DC Plan:  HOME/SELF CARE      DC Planning Services  CM consult      Choice offered to / List presented to:             Status of service:  Completed, signed off Medicare Important Message given?   (If response is "NO", the following Medicare IM given date fields will be blank) Date Medicare IM given:   Date Additional Medicare IM given:    Discharge Disposition:  HOME/SELF CARE  Per UR Regulation:  Reviewed for med. necessity/level of care/duration of stay  If discussed at Long Length of Stay Meetings, dates discussed:    Comments:  08/20/12 14:35 Letha Cape RN, BSN [phone] patient lives with spouse, pta indep.  No needs anticipated.

## 2012-08-20 NOTE — Clinical Social Work Note (Signed)
Clinical Social Work   CSW received consult for current substance abuse. Pt discharged prior to assessment. CSW is signing off.   Dede Query, MSW, LCSW [phone]

## 2012-08-20 NOTE — Progress Notes (Signed)
[facility] Progress Note  Subjective:  Had a large solid BM this morning; was dark in color but no red blood/maroon.  Feels good and wants to go home today.  Objective:  Vital signs in last 24 hours: Temp:  [97.7 F (36.5 C)-98.8 F (37.1 C)] 97.7 F (36.5 C) (05/09 0617) Pulse Rate:  [76-88] 85 (05/09 0617) Resp:  [16-20] 18 (05/09 0617) BP: (103-118)/(63-75) 117/73 mmHg (05/09 0617) SpO2:  [97 %-99 %] 99 % (05/09 0617) Last BM Date: 08/18/12 General:   Alert, Well-developed, in NAD Heart:  Regular rate and rhythm; no murmurs Pulm:  CTAB.  No W/R/R. Abdomen:  Soft, nontender and nondistended. Normal bowel sounds, without guarding, and without rebound.   Extremities:  Without edema. Neurologic:  Alert and  oriented x4;  grossly normal neurologically. Psych:  Alert and cooperative. Normal mood and affect.  Intake/Output from previous day: 05/08 0701 - 05/09 0700 In: 240 [P.O.:240] Out: -   Lab Results:  Recent Labs  08/19/12 0900 08/19/12 1611 08/20/12 0445  WBC 5.0 5.1 5.3  HGB 8.5* 9.4* 8.4*  HCT 23.5* 26.8* 23.9*  PLT 173 155 152   BMET  Recent Labs  08/17/12 1800 08/18/12 0609 08/19/12 0430  NA 136 138 141  K 3.5 3.7 3.8  CL 101 106 107  CO2 28 28 27   GLUCOSE 106* 107* 98  BUN 28* 19 17  CREATININE 0.84 0.82 0.87  CALCIUM 8.5 8.1* 8.4   LFT  Recent Labs  08/18/12 0609  PROT 4.9*  ALBUMIN 2.9*  AST 17  ALT 17  ALKPHOS 48  BILITOT 0.9   PT/INR  Recent Labs  08/17/12 2242  LABPROT 13.8  INR 1.07   Assessment / Plan: -UGIB secondary to active bleeding DU's seen on EGD 5/7 s/p hemostasis.  H. Pylori studies are negative. -ABLA, symptomatic with episodes of presyncope prior to admission. S/p two units of PRBC's 5/8.  Hgb down slightly again this morning but no sign of ongoing bleeding. -Azotemia secondary to GI bleed, resolved.  -ETOH abuse. No clear signs of dependence.   *Avoid NSAID's and ASA.  *Change PPI to PO BID,  which he should continue as an outpatient for now. *Follow-up appointment with Dr. Russella Dar has been arranged and listed in D/C instructions. *ETOH abstinence.     LOS: 3 days   ZEHR, JESSICA D.  08/20/2012, 9:53 AM  Pager number [phone]  GI ATTENDING  Patient seen and examined. Agree with above interval history, labs,and exam. No further bleeding. OK for D/C. Plan as outlined above in bold.  Wilhemina Bonito. Eda Keys., M.D. [facility] Division of Gastroenterology

## 2012-08-20 NOTE — Discharge Summary (Signed)
PATIENT DETAILS Name: Christopher Hendricks Age: 67 y.o. Sex: male Date of Birth: [birth year] MRN: [id number]. Admit Date: 08/17/2012 Admitting Physician: Eduard Clos, MD PCP:No primary provider on file.  Recommendations for Outpatient Follow-up:  Repeat CBC at next visit  PRIMARY DISCHARGE DIAGNOSIS:  Active Problems:   Upper GI bleed   Anemia   Alcoholism   Duodenal ulcer with hemorrhage   Acute posthemorrhagic anemia      PAST MEDICAL HISTORY: Past Medical History  Diagnosis Date  . Sigmoid diverticulosis 2007  . Alcoholism /alcohol abuse     DISCHARGE MEDICATIONS:   Medication List    STOP taking these medications       Biotin 5 MG Caps     multivitamin with minerals tablet     saw palmetto 160 MG capsule     ZINC 15 PO      TAKE these medications       pantoprazole 40 MG tablet  Commonly known as:  PROTONIX  Take 1 tablet (40 mg total) by mouth 2 (two) times daily with a meal.        ALLERGIES:   Allergies  Allergen Reactions  . Codeine Nausea And Vomiting  . Sulfa Antibiotics Rash    BRIEF HPI:  See H&P, Labs, Consult and Test reports for all details in brief, Christopher Hendricks is a 67 y.o. male with no significant past medical history was referred to the ER because patient was complaining of dizziness and near syncopal episodes. Patient had initial episode 2 days prior to admission when he woke up in the morning 2 AM and felt like almost passing out. He was in the bathroom and sat down for some time and went back to the bed. A similar episode today when he was walking uphill. After he took some rest he felt better. Patient also had some exertional shortness of breath. Due to these symptoms he had gone to the urgent care. Over there blood work showed proper hemoglobin of 5 g from previous in their records. Patient also has been noticing dark stools last few days. In the ER patient's rectal exam by the ER physician showed black stools. He was then  admitted for further evaluation and treatment.  CONSULTATIONS:   GI  PERTINENT RADIOLOGIC STUDIES: No results found.   PERTINENT LAB RESULTS: CBC:  Recent Labs  08/19/12 1611 08/20/12 0445  WBC 5.1 5.3  HGB 9.4* 8.4*  HCT 26.8* 23.9*  PLT 155 152   CMET CMP     Component Value Date/Time   NA 141 08/19/2012 0430   K 3.8 08/19/2012 0430   CL 107 08/19/2012 0430   CO2 27 08/19/2012 0430   GLUCOSE 98 08/19/2012 0430   BUN 17 08/19/2012 0430   CREATININE 0.87 08/19/2012 0430   CALCIUM 8.4 08/19/2012 0430   PROT 4.9* 08/18/2012 0609   ALBUMIN 2.9* 08/18/2012 0609   AST 17 08/18/2012 0609   ALT 17 08/18/2012 0609   ALKPHOS 48 08/18/2012 0609   BILITOT 0.9 08/18/2012 0609   GFRNONAA 89* 08/19/2012 0430   GFRAA >90 08/19/2012 0430    GFR Estimated Creatinine Clearance: 85.4 ml/min (by C-G formula based on Cr of 0.87). No results found for this basename: LIPASE, AMYLASE,  in the last 72 hours No results found for this basename: CKTOTAL, CKMB, CKMBINDEX, TROPONINI,  in the last 72 hours No components found with this basename: POCBNP,  No results found for this basename: DDIMER,  in the last 72 hours  No results found for this basename: HGBA1C,  in the last 72 hours No results found for this basename: CHOL, HDL, LDLCALC, TRIG, CHOLHDL, LDLDIRECT,  in the last 72 hours No results found for this basename: TSH, T4TOTAL, FREET3, T3FREE, THYROIDAB,  in the last 72 hours No results found for this basename: VITAMINB12, FOLATE, FERRITIN, TIBC, IRON, RETICCTPCT,  in the last 72 hours Coags:  Recent Labs  08/17/12 2242  INR 1.07   Microbiology: No results found for this or any previous visit (from the past 240 hour(s)).   BRIEF HOSPITAL COURSE:   Active Problems:   Upper GI Bleed - Patient was  admitted, placed on a Protonix infusion. GI was consulted. EGD was done on 5/7 which showed actively bleeding duodenal ulcer. Patient required hemostasis of this bleeding with endoscopy injection of epinephrine.  He was monitored 48 hours post EGD, he finally had a bowel movement this morning which is still somewhat dark but not as dark as prior to admission, there was no blood or any maroonish color in it. It is felt that the patient's bleeding has resolved, GI has also seen the patient today and recommended discharge. Patient is very she is to be discharged home for the past 2 days, he is being discharged home in stable condition. I have counseled him that he needs to completely avoid alcohol and nonsteroidal anti-inflammatory medications, he understands. He will followup with Dr. Russella Dar later this month. He will need a repeat CBC at his next visit with his primary care practitioner or with gastroenterology. - From the history obtained, he takes significant amounts of aspirin to prevent a hangover/headache when he drinks alcohol. This is likely the cause of his duodenal ulceration.    Anemia  -acute blood loss from above - Patient was transfused 2 units of PRBC on 5/8. Hemoglobin on discharge is 8.4. He would need a repeat CBC when he follows up with his primary care practitioner.  Dizziness/Pre-syncopal episodes  -suspect orthostatic hypotensive from GI bleed  -seems to have resolved-patient ambulating in the room   ETOH Use  -no signs of withdrawal  -Was placed folic acid, thiamine and Ativan per CIWA protocol  TODAY-DAY OF DISCHARGE:  Subjective:   Christopher Hendricks today has no headache,no chest abdominal pain,no new weakness tingling or numbness, feels much better wants to go home today.   Objective:   Blood pressure 117/73, pulse 85, temperature 97.7 F (36.5 C), temperature source Oral, resp. rate 18, height 5\' 11"  (1.803 m), weight 71.26 kg (157 lb 1.6 oz), SpO2 99.00%.  Intake/Output Summary (Last 24 hours) at 08/20/12 1131 Last data filed at 08/20/12 0940  Gross per 24 hour  Intake    560 ml  Output      0 ml  Net    560 ml   Filed Weights   08/17/12 2227  Weight: 71.26 kg (157 lb  1.6 oz)    Exam Awake Alert, Oriented *3, No new F.N deficits, Normal affect [place].AT,PERRAL Supple Neck,No JVD, No cervical lymphadenopathy appriciated.  Symmetrical Chest wall movement, Good air movement bilaterally, CTAB RRR,No Gallops,Rubs or new Murmurs, No Parasternal Heave +ve B.Sounds, Abd Soft, Non tender, No organomegaly appriciated, No rebound -guarding or rigidity. No Cyanosis, Clubbing or edema, No new Rash or bruise  DISCHARGE CONDITION: Stable  DISPOSITION: Home   DISCHARGE INSTRUCTIONS:    Activity:  As tolerated   Diet recommendation: Regular Diet       Discharge Orders   Future Appointments Provider Department  Dept Phone   09/08/2012 9:00 AM Elvina Sidle, MD [facility] [phone]   09/10/2012 11:00 AM Meryl Dare, MD [facility] [phone]   Future Orders Complete By Expires     Call MD for:  persistant dizziness or light-headedness  As directed     Call MD for:  persistant nausea and vomiting  As directed     Call MD for:  severe uncontrolled pain  As directed     Call MD for:  As directed     Scheduling Instructions:      Weakness, blood stools or melanotic stools    Diet general  As directed     Increase activity slowly  As directed        Follow-up Information   Follow up with Judie Petit T. Russella Dar, MD On 09/10/2012. (11:00 am)    Contact information:   [address] [place] [address] [phone]       Follow up with [facility]. Schedule an appointment as soon as possible for a visit in 1 week.   Contact information:   [address] [place] [address] [phone]     Total Time spent on discharge equals 45 minutes.  SignedJeoffrey Hendricks 08/20/2012 11:31 AM

## 2012-08-20 NOTE — Progress Notes (Signed)
Christopher Hendricks discharged Home with wife per MD order.  Discharge instructions reviewed and discussed with the patient, all questions and concerns answered. Copy of instructions and scripts given to patient.    Medication List    STOP taking these medications       Biotin 5 MG Caps     multivitamin with minerals tablet     saw palmetto 160 MG capsule     ZINC 15 PO      TAKE these medications       pantoprazole 40 MG tablet  Commonly known as:  PROTONIX  Take 1 tablet (40 mg total) by mouth 2 (two) times daily with a meal.        Patients skin is clean, dry and intact, no evidence of skin break down. IV site discontinued and catheter remains intact. Site without signs and symptoms of complications. Dressing and pressure applied.  Patient escorted to car by NT ambulating,  no distress noted upon discharge.  Laural Benes, Debarah Mccumbers C 08/20/2012 12:06 PM

## 2012-08-25 ENCOUNTER — Telehealth: Payer: Self-pay

## 2012-08-25 NOTE — Telephone Encounter (Signed)
Pt has a question about his bloodwork he is going to have done and wondering if the results will be in in time for his appt.   Please call [phone]

## 2012-08-25 NOTE — Telephone Encounter (Signed)
Called him. He states he has appt scheduled for 5/28 with Dr Milus Glazier. He was recently hospitalized. He is asking if okay to keep appt on 5/28, or if he needs to come in sooner, please advise. Ayleah Hofmeister

## 2012-08-25 NOTE — Telephone Encounter (Signed)
Called, line busy.  

## 2012-08-25 NOTE — Telephone Encounter (Signed)
Okay to follow up 5/28.

## 2012-08-26 NOTE — Telephone Encounter (Signed)
Thanks, I have advised him. He is advised if anything gets worse he is to return to clinic.

## 2012-09-08 ENCOUNTER — Encounter: Payer: Self-pay | Admitting: Family Medicine

## 2012-09-08 ENCOUNTER — Ambulatory Visit (INDEPENDENT_AMBULATORY_CARE_PROVIDER_SITE_OTHER): Payer: Medicare Other | Admitting: Family Medicine

## 2012-09-08 VITALS — BP 114/70 | HR 68 | Temp 97.7°F | Resp 16 | Ht 71.0 in | Wt 151.0 lb

## 2012-09-08 DIAGNOSIS — Z139 Encounter for screening, unspecified: Secondary | ICD-10-CM

## 2012-09-08 DIAGNOSIS — M25512 Pain in left shoulder: Secondary | ICD-10-CM

## 2012-09-08 DIAGNOSIS — Z0000 Encounter for general adult medical examination without abnormal findings: Secondary | ICD-10-CM

## 2012-09-08 LAB — LIPID PANEL
Cholesterol: 200 mg/dL (ref 0–200)
HDL: 56 mg/dL (ref >39)
LDL Cholesterol: 130 mg/dL — ABNORMAL HIGH (ref 0–99)
Total CHOL/HDL Ratio: 3.6 Ratio
Triglycerides: 72 mg/dL (ref <150)
VLDL: 14 mg/dL (ref 0–40)

## 2012-09-08 LAB — COMPREHENSIVE METABOLIC PANEL
ALT: 33 U/L (ref 0–53)
AST: 29 U/L (ref 0–37)
Albumin: 4.6 g/dL (ref 3.5–5.2)
Alkaline Phosphatase: 70 U/L (ref 39–117)
BUN: 8 mg/dL (ref 6–23)
CO2: 27 mEq/L (ref 19–32)
Calcium: 9.4 mg/dL (ref 8.4–10.5)
Chloride: 104 mEq/L (ref 96–112)
Creat: 0.85 mg/dL (ref 0.50–1.35)
Glucose, Bld: 88 mg/dL (ref 70–99)
Potassium: 4.5 mEq/L (ref 3.5–5.3)
Sodium: 142 mEq/L (ref 135–145)
Total Bilirubin: 1 mg/dL (ref 0.3–1.2)
Total Protein: 7.1 g/dL (ref 6.0–8.3)

## 2012-09-08 LAB — POCT URINALYSIS DIPSTICK
Bilirubin, UA: NEGATIVE
Blood, UA: NEGATIVE
Glucose, UA: NEGATIVE
Ketones, UA: NEGATIVE
Leukocytes, UA: NEGATIVE
Nitrite, UA: NEGATIVE
Protein, UA: NEGATIVE
Spec Grav, UA: 1.02
Urobilinogen, UA: 0.2
pH, UA: 6.5

## 2012-09-08 LAB — CBC WITH DIFFERENTIAL/PLATELET
Basophils Absolute: 0.1 10*3/uL (ref 0.0–0.1)
Basophils Relative: 1 % (ref 0–1)
Eosinophils Absolute: 0.1 10*3/uL (ref 0.0–0.7)
Eosinophils Relative: 2 % (ref 0–5)
HCT: 31.7 % — ABNORMAL LOW (ref 39.0–52.0)
Hemoglobin: 10.2 g/dL — ABNORMAL LOW (ref 13.0–17.0)
Lymphocytes Relative: 24 % (ref 12–46)
Lymphs Abs: 0.9 10*3/uL (ref 0.7–4.0)
MCH: 27.4 pg (ref 26.0–34.0)
MCHC: 32.2 g/dL (ref 30.0–36.0)
MCV: 85.2 fL (ref 78.0–100.0)
Monocytes Absolute: 0.5 10*3/uL (ref 0.1–1.0)
Monocytes Relative: 14 % — ABNORMAL HIGH (ref 3–12)
Neutro Abs: 2.2 10*3/uL (ref 1.7–7.7)
Neutrophils Relative %: 59 % (ref 43–77)
Platelets: 325 10*3/uL (ref 150–400)
RBC: 3.72 MIL/uL — ABNORMAL LOW (ref 4.22–5.81)
RDW: 16.5 % — ABNORMAL HIGH (ref 11.5–15.5)
WBC: 3.7 10*3/uL — ABNORMAL LOW (ref 4.0–10.5)

## 2012-09-08 LAB — PSA: PSA: 0.65 ng/mL (ref <=4.00)

## 2012-09-08 LAB — TSH: TSH: 0.841 u[IU]/mL (ref 0.350–4.500)

## 2012-09-08 LAB — IFOBT (OCCULT BLOOD): IFOBT: NEGATIVE

## 2012-09-08 NOTE — Progress Notes (Signed)
  Subjective:    Patient ID: Christopher Hendricks, male    DOB: 07-01-1945, 67 y.o.   MRN: [id number]  HPI    Review of Systems  Constitutional: Positive for fatigue.  HENT: Negative.   Eyes: Negative.   Respiratory: Negative.   Cardiovascular: Negative.   Gastrointestinal: Positive for blood in stool.       Earlier this month  Endocrine: Negative.   Genitourinary: Negative.   Musculoskeletal: Positive for back pain and arthralgias.  Skin: Negative.   Allergic/Immunologic: Negative.   Neurological: Positive for dizziness and light-headedness.       Earlier this month  Hematological: Negative.   Psychiatric/Behavioral: Negative.        Objective:   Physical Exam        Assessment & Plan:

## 2012-09-08 NOTE — Patient Instructions (Addendum)
Health Maintenance, Males A healthy lifestyle and preventative care can promote health and wellness.  Maintain regular health, dental, and eye exams.  Eat a healthy diet. Foods like vegetables, fruits, whole grains, low-fat dairy products, and lean protein foods contain the nutrients you need without too many calories. Decrease your intake of foods high in solid fats, added sugars, and salt. Get information about a proper diet from your caregiver, if necessary.  Regular physical exercise is one of the most important things you can do for your health. Most adults should get at least 150 minutes of moderate-intensity exercise (any activity that increases your heart rate and causes you to sweat) each week. In addition, most adults need muscle-strengthening exercises on 2 or more days a week.   Maintain a healthy weight. The body mass index (BMI) is a screening tool to identify possible weight problems. It provides an estimate of body fat based on height and weight. Your caregiver can help determine your BMI, and can help you achieve or maintain a healthy weight. For adults 20 years and older:  A BMI below 18.5 is considered underweight.  A BMI of 18.5 to 24.9 is normal.  A BMI of 25 to 29.9 is considered overweight.  A BMI of 30 and above is considered obese.  Maintain normal blood lipids and cholesterol by exercising and minimizing your intake of saturated fat. Eat a balanced diet with plenty of fruits and vegetables. Blood tests for lipids and cholesterol should begin at age 20 and be repeated every 5 years. If your lipid or cholesterol levels are high, you are over 50, or you are a high risk for heart disease, you may need your cholesterol levels checked more frequently.Ongoing high lipid and cholesterol levels should be treated with medicines, if diet and exercise are not effective.  If you smoke, find out from your caregiver how to quit. If you do not use tobacco, do not start.  If you  choose to drink alcohol, do not exceed 2 drinks per day. One drink is considered to be 12 ounces (355 mL) of beer, 5 ounces (148 mL) of wine, or 1.5 ounces (44 mL) of liquor.  Avoid use of street drugs. Do not share needles with anyone. Ask for help if you need support or instructions about stopping the use of drugs.  High blood pressure causes heart disease and increases the risk of stroke. Blood pressure should be checked at least every 1 to 2 years. Ongoing high blood pressure should be treated with medicines if weight loss and exercise are not effective.  If you are 45 to 66 years old, ask your caregiver if you should take aspirin to prevent heart disease.  Diabetes screening involves taking a blood sample to check your fasting blood sugar level. This should be done once every 3 years, after age 45, if you are within normal weight and without risk factors for diabetes. Testing should be considered at a younger age or be carried out more frequently if you are overweight and have at least 1 risk factor for diabetes.  Colorectal cancer can be detected and often prevented. Most routine colorectal cancer screening begins at the age of 50 and continues through age 75. However, your caregiver may recommend screening at an earlier age if you have risk factors for colon cancer. On a yearly basis, your caregiver may provide home test kits to check for hidden blood in the stool. Use of a small camera at the end of a tube,   to directly examine the colon (sigmoidoscopy or colonoscopy), can detect the earliest forms of colorectal cancer. Talk to your caregiver about this at age 50, when routine screening begins. Direct examination of the colon should be repeated every 5 to 10 years through age 75, unless early forms of pre-cancerous polyps or small growths are found.  Hepatitis C blood testing is recommended for all people born from 1945 through 1965 and any individual with known risks for hepatitis C.  Healthy  men should no longer receive prostate-specific antigen (PSA) blood tests as part of routine cancer screening. Consult with your caregiver about prostate cancer screening.  Testicular cancer screening is not recommended for adolescents or adult males who have no symptoms. Screening includes self-exam, caregiver exam, and other screening tests. Consult with your caregiver about any symptoms you have or any concerns you have about testicular cancer.  Practice safe sex. Use condoms and avoid high-risk sexual practices to reduce the spread of sexually transmitted infections (STIs).  Use sunscreen with a sun protection factor (SPF) of 30 or greater. Apply sunscreen liberally and repeatedly throughout the day. You should seek shade when your shadow is shorter than you. Protect yourself by wearing long sleeves, pants, a wide-brimmed hat, and sunglasses year round, whenever you are outdoors.  Notify your caregiver of new moles or changes in moles, especially if there is a change in shape or color. Also notify your caregiver if a mole is larger than the size of a pencil eraser.  A one-time screening for abdominal aortic aneurysm (AAA) and surgical repair of large AAAs by sound wave imaging (ultrasonography) is recommended for ages 65 to 75 years who are current or former smokers.  Stay current with your immunizations. Document Released: 09/27/2007 Document Revised: 06/23/2011 Document Reviewed: 08/26/2010 [employer] Patient Information 2014 [employer].  

## 2012-09-08 NOTE — Progress Notes (Signed)
Patient ID: Christopher Hendricks MRN: [id number], DOB: 1945/07/21 67 y.o. Date of Encounter: 09/08/2012, 9:30 AM  Primary Physician: No primary provider on file.  Chief Complaint: Physical (CPE)  HPI: 67 y.o. y/o male with history noted below here for CPE.  Doing well. Patient was admitted for peptic ulcer secondary to too much ASA earlier this month that resulted in hospitalization for three days.  He had been on taking 4 aspirin daily for 20 years to control headaches.  Transfused x 2 units. Accountant:  Still practicing. Married, 1 daughter 59 yo IDDM, 2 grandchildren Colonoscopy 2007 Last dT:  2011  Review of Systems: Consitutional: No fever, chills, fatigue, night sweats, lymphadenopathy, or weight changes. Eyes: No visual changes, eye redness, or discharge. ENT/Mouth: Ears: No otalgia, tinnitus, hearing loss, discharge. Nose: No congestion, rhinorrhea, sinus pain, or epistaxis. Throat: No sore throat, post nasal drip, or teeth pain. Cardiovascular: No CP, palpitations, diaphoresis, DOE, edema, orthopnea, PND. Respiratory: No cough, hemoptysis, SOB, or wheezing. Gastrointestinal: No anorexia, dysphagia, reflux, pain, nausea, vomiting, hematemesis, diarrhea, constipation, BRBPR, or melena. Genitourinary: No dysuria, frequency, urgency, hematuria, incontinence, nocturia, decreased urinary stream, discharge, impotence, or testicular pain/masses. Musculoskeletal: No decreased ROM, myalgias, stiffness, joint swelling, or weakness. Skin: No rash, erythema, lesion changes, pain, warmth, jaundice, or pruritis. Neurological: No headache, dizziness, syncope, seizures, tremors, memory loss, coordination problems, or paresthesias. Psychological: No anxiety, depression, hallucinations, SI/HI. Endocrine: No fatigue, polydipsia, polyphagia, polyuria, or known diabetes. All other systems were reviewed and are otherwise negative.  Past Medical History  Diagnosis Date  . Sigmoid diverticulosis  2007  . Alcoholism /alcohol abuse   . Duodenal ulcer      Past Surgical History  Procedure Laterality Date  . Hernia repair    . Colonoscopy  2007    sigmoid diverticulosis.  Dr Russella Dar  . Esophagogastroduodenoscopy N/A 08/18/2012    Procedure: ESOPHAGOGASTRODUODENOSCOPY (EGD);  Surgeon: Hilarie Fredrickson, MD;  Location[facility] ENDOSCOPY;  Service: Endoscopy;  Laterality: N/A;    Home Meds:  Prior to Admission medications   Medication Sig Start Date End Date Taking? Authorizing Provider  Acetaminophen (TYLENOL PO) Take by mouth.   Yes Historical Provider, MD  DiphenhydrAMINE HCl (BENADRYL PO) Take by mouth.   Yes Historical Provider, MD  Multiple Vitamins-Minerals (MULTIVITAMIN PO) Take by mouth. chewable   Yes Historical Provider, MD  OVER THE COUNTER MEDICATION Sleep aide   Yes Historical Provider, MD  pantoprazole (PROTONIX) 40 MG tablet Take 1 tablet (40 mg total) by mouth 2 (two) times daily with a meal. 08/20/12  Yes Shanker Levora Dredge, MD    Allergies:  Allergies  Allergen Reactions  . Codeine Nausea And Vomiting  . Sulfa Antibiotics Rash    History   Social History  . Marital Status: Married    Spouse Name: N/A    Number of Children: N/A  . Years of Education: N/A   Occupational History  . CPA    Social History Main Topics  . Smoking status: Never Smoker   . Smokeless tobacco: Not on file  . Alcohol Use: Yes     Comment: 1 quart of day  . Drug Use: No  . Sexually Active: Yes   Other Topics Concern  . Not on file   Social History Narrative  . No narrative on file    Family History  Problem Relation Age of Onset  . Diabetes Daughter     Physical Exam: Blood pressure 114/70, pulse 68, temperature 97.7 F (36.5 C),  temperature source Oral, resp. rate 16, height 5\' 11"  (1.803 m), weight 151 lb (68.493 kg), SpO2 99.00%.  General: Well developed, well nourished, in no acute distress. HEENT: Normocephalic, atraumatic. Conjunctiva pink, sclera non-icteric. Pupils 2 mm  constricting to 1 mm, round, regular, and equally reactive to light and accomodation. EOMI. Internal auditory canal clear. TMs with good cone of light and without pathology. Nasal mucosa pink. Nares are without discharge. No sinus tenderness. Oral mucosa pink. Dentition good. Pharynx without exudate.   Neck: Supple. Trachea midline. No thyromegaly. Full ROM. No lymphadenopathy. Lungs: Clear to auscultation bilaterally without wheezes, rales, or rhonchi. Breathing is of normal effort and unlabored. Cardiovascular: RRR with S1 S2. No murmurs, rubs, or gallops appreciated. Distal pulses 2+ symmetrically. No carotid or abdominal bruits Abdomen: Soft, non-tender, non-distended with normoactive bowel sounds. No hepatosplenomegaly or masses. No rebound/guarding. No CVA tenderness. Without hernias.  Rectal: No external hemorrhoids or fissures. Rectal vault without masses.  Genitourinary:  circumcised male. No penile lesions. Testes descended bilaterally, and smooth without tenderness or masses.  Musculoskeletal: Full range of motion and 5/5 strength throughout. Without swelling, atrophy, tenderness, crepitus, or warmth. Extremities without clubbing, cyanosis, or edema. Calves supple. Skin: Warm and moist without erythema, ecchymosis, wounds, or rash. Neuro: A+Ox3. CN II-XII grossly intact. Moves all extremities spontaneously. Full sensation throughout. Normal gait. DTR 2+ throughout upper and lower extremities. Finger to nose intact. Psych:  Responds to questions appropriately with a normal affect.   Studies: CBC, CMET, Lipid, PSA, TSH UA:  Discussed advanced directives:  No ventilator requested  Assessment/Plan:  67 y.o. y/o  male here for CPE -Zostavax written Routine general medical examination at a health care facility - Plan: POCT urinalysis dipstick, CBC with Differential, Comprehensive metabolic panel, Lipid panel, TSH, PSA, IFOBT POC (occult bld, rslt in office) continue the bid  iron  Signed, Elvina Sidle, MD 09/08/2012 9:30 AM

## 2012-09-10 ENCOUNTER — Encounter: Payer: Self-pay | Admitting: Gastroenterology

## 2012-09-10 ENCOUNTER — Ambulatory Visit (INDEPENDENT_AMBULATORY_CARE_PROVIDER_SITE_OTHER): Payer: Medicare Other | Admitting: Gastroenterology

## 2012-09-10 VITALS — BP 120/80 | HR 66 | Ht 71.5 in | Wt 152.0 lb

## 2012-09-10 DIAGNOSIS — K260 Acute duodenal ulcer with hemorrhage: Secondary | ICD-10-CM

## 2012-09-10 DIAGNOSIS — D62 Acute posthemorrhagic anemia: Secondary | ICD-10-CM

## 2012-09-10 NOTE — Patient Instructions (Addendum)
Over one weeks times decrease your pantoprazole 40 mg to once daily instead of twice daily. Stay on once daily dosing long-term.   Thank you for choosing me and [facility].  Venita Lick. Pleas Koch., MD., Clementeen Graham

## 2012-09-10 NOTE — Progress Notes (Signed)
History of Present Illness: This is a 67 year old male hospitalized for a bleeding duodenal ulcer. He underwent endoscopy by Dr. Marina Goodell. H. pylori was negative. Ongoing aspirin use was stopped. Seen in followup by Dr. Milus Glazier and his hemoglobin has increased to 10 2. He has no gastrointestinal complaints. Endoscopy report and GI notes from hospitalization were reviewed.  Current Medications, Allergies, Past Medical History, Past Surgical History, Family History and Social History were reviewed in [employer] record.  Physical Exam: General: Well developed , well nourished, no acute distress Head: Normocephalic and atraumatic Eyes:  sclerae anicteric, EOMI Ears: Normal auditory acuity Mouth: No deformity or lesions Lungs: Clear throughout to auscultation Heart: Regular rate and rhythm; no murmurs, rubs or bruits Abdomen: Soft, non tender and non distended. No masses, hepatosplenomegaly or hernias noted. Normal Bowel sounds Musculoskeletal: Symmetrical with no gross deformities  Pulses:  Normal pulses noted Extremities: No clubbing, cyanosis, edema or deformities noted Neurological: Alert oriented x 4, grossly nonfocal Psychological:  Alert and cooperative. Normal mood and affect  Assessment and Recommendations:  1. DU with acute bleed in early May. H. Pylori studies were negative.  2. ABLA. Hb has increased to 10.2. Follow up with PCP. 3. Colorectal cancer screening, average risk.  *Avoid NSAID's and ASA.  *Change PPI to daily after completing 4 weeks of twice a day therapy. He should remain on daily PPI indefinitely to help prevent recurrent ulcers. *ETOH abstinence.  *Screening colonoscopy due in October 2017

## 2013-03-01 ENCOUNTER — Ambulatory Visit (INDEPENDENT_AMBULATORY_CARE_PROVIDER_SITE_OTHER): Payer: Medicare Other

## 2013-03-01 DIAGNOSIS — Z23 Encounter for immunization: Secondary | ICD-10-CM

## 2013-04-13 ENCOUNTER — Ambulatory Visit (INDEPENDENT_AMBULATORY_CARE_PROVIDER_SITE_OTHER): Payer: Medicare Other | Admitting: Emergency Medicine

## 2013-04-13 ENCOUNTER — Ambulatory Visit: Payer: Medicare Other

## 2013-04-13 VITALS — BP 120/72 | HR 84 | Temp 99.9°F | Resp 18 | Ht 72.0 in | Wt 153.0 lb

## 2013-04-13 DIAGNOSIS — R05 Cough: Secondary | ICD-10-CM

## 2013-04-13 DIAGNOSIS — J209 Acute bronchitis, unspecified: Secondary | ICD-10-CM

## 2013-04-13 LAB — POCT CBC
Granulocyte percent: 71.6 %G (ref 37–80)
MCV: 95.3 fL (ref 80–97)
MID (cbc): 1.1 — AB (ref 0–0.9)
MPV: 9.7 fL (ref 0–99.8)
POC Granulocyte: 6.5 (ref 2–6.9)
POC MID %: 12.2 %M — AB (ref 0–12)
Platelet Count, POC: 220 10*3/uL (ref 142–424)
RBC: 4.81 M/uL (ref 4.69–6.13)

## 2013-04-13 LAB — POCT INFLUENZA A/B: Influenza B, POC: NEGATIVE

## 2013-04-13 MED ORDER — AZITHROMYCIN 250 MG PO TABS: ORAL_TABLET | ORAL | Status: DC

## 2013-04-13 MED ORDER — BENZONATATE 100 MG PO CAPS: 100.0000 mg | ORAL_CAPSULE | Freq: Three times a day (TID) | ORAL | Status: DC | PRN

## 2013-04-13 NOTE — Progress Notes (Signed)
   Subjective:    Patient ID: Christopher Hendricks, male    DOB: Oct 18, 1945, 67 y.o.   MRN: [id number]  HPI 67 y.o. Male presents to clinic with cough and headache. States that Sunday he noticed cough and sniffles, went to church anyway. Has since felt worse. Has body aches. Productive cough with green/yellow phlegm.     Review of Systems     Objective:   Physical Exam HEENT exam is unremarkable. Neck is supple. Chest exam reveals coarse breath sounds no rales heart regular rate no murmurs.   Results for orders placed in visit on 04/13/13  POCT INFLUENZA A/B      Result Value Range   Influenza A, POC Negative     Influenza B, POC Negative     [facility] reading (PRIMARY) by  Dr.Raphel Stickles no acute disease Results for orders placed in visit on 04/13/13  POCT INFLUENZA A/B      Result Value Range   Influenza A, POC Negative     Influenza B, POC Negative    POCT CBC      Result Value Range   WBC 9.1  4.6 - 10.2 K/uL   Lymph, poc 1.5  0.6 - 3.4   POC LYMPH PERCENT 16.2  10 - 50 %L   MID (cbc) 1.1 (*) 0 - 0.9   POC MID % 12.2 (*) 0 - 12 %M   POC Granulocyte 6.5  2 - 6.9   Granulocyte percent 71.6  37 - 80 %G   RBC 4.81  4.69 - 6.13 M/uL   Hemoglobin 14.3  14.1 - 18.1 g/dL   HCT, POC 14.7  82.9 - 53.7 %   MCV 95.3  80 - 97 fL   MCH, POC 29.7  27 - 31.2 pg   MCHC 31.2 (*) 31.8 - 35.4 g/dL   RDW, POC 56.2     Platelet Count, POC 220  142 - 424 K/uL   MPV 9.7  0 - 99.8 fL       Assessment & Plan:  White count is normal chest x-ray no pneumonia will treat with a Z-Pak and Tessalon Perles

## 2013-04-13 NOTE — Patient Instructions (Signed)

## 2014-02-09 ENCOUNTER — Encounter: Payer: Medicare Other | Admitting: Family Medicine

## 2014-02-20 ENCOUNTER — Encounter: Payer: Medicare Other | Admitting: Family Medicine

## 2014-03-10 ENCOUNTER — Ambulatory Visit (INDEPENDENT_AMBULATORY_CARE_PROVIDER_SITE_OTHER): Payer: Medicare Other | Admitting: Family Medicine

## 2014-03-10 ENCOUNTER — Encounter: Payer: Self-pay | Admitting: Family Medicine

## 2014-03-10 VITALS — BP 116/72 | HR 60 | Temp 97.2°F | Resp 16 | Ht 71.5 in | Wt 150.8 lb

## 2014-03-10 DIAGNOSIS — Z13220 Encounter for screening for lipoid disorders: Secondary | ICD-10-CM

## 2014-03-10 DIAGNOSIS — Z8719 Personal history of other diseases of the digestive system: Secondary | ICD-10-CM

## 2014-03-10 DIAGNOSIS — Z125 Encounter for screening for malignant neoplasm of prostate: Secondary | ICD-10-CM

## 2014-03-10 DIAGNOSIS — Z1211 Encounter for screening for malignant neoplasm of colon: Secondary | ICD-10-CM

## 2014-03-10 DIAGNOSIS — Z23 Encounter for immunization: Secondary | ICD-10-CM

## 2014-03-10 DIAGNOSIS — Z1329 Encounter for screening for other suspected endocrine disorder: Secondary | ICD-10-CM

## 2014-03-10 DIAGNOSIS — Z130 Encounter for screening for diseases of the blood and blood-forming organs and certain disorders involving the immune mechanism: Secondary | ICD-10-CM

## 2014-03-10 DIAGNOSIS — Z0000 Encounter for general adult medical examination without abnormal findings: Secondary | ICD-10-CM

## 2014-03-10 LAB — COMPLETE METABOLIC PANEL WITHOUT GFR
ALT: 33 U/L (ref 0–53)
AST: 31 U/L (ref 0–37)
Albumin: 4.3 g/dL (ref 3.5–5.2)
CO2: 30 meq/L (ref 19–32)
Calcium: 9.1 mg/dL (ref 8.4–10.5)
Glucose, Bld: 77 mg/dL (ref 70–99)

## 2014-03-10 LAB — TSH: TSH: 1.021 u[IU]/mL (ref 0.350–4.500)

## 2014-03-10 LAB — LIPID PANEL
Cholesterol: 180 mg/dL (ref 0–200)
HDL: 61 mg/dL (ref >39)
LDL Cholesterol: 108 mg/dL — ABNORMAL HIGH (ref 0–99)
Total CHOL/HDL Ratio: 3 Ratio
Triglycerides: 56 mg/dL (ref <150)
VLDL: 11 mg/dL (ref 0–40)

## 2014-03-10 LAB — COMPLETE METABOLIC PANEL WITH GFR
Alkaline Phosphatase: 74 U/L (ref 39–117)
BUN: 10 mg/dL (ref 6–23)
Chloride: 104 mEq/L (ref 96–112)
Creat: 0.79 mg/dL (ref 0.50–1.35)
GFR, Est African American: >89 mL/min
GFR, Est Non African American: >89 mL/min
Potassium: 4.2 mEq/L (ref 3.5–5.3)
Sodium: 141 mEq/L (ref 135–145)
Total Bilirubin: 1.3 mg/dL — ABNORMAL HIGH (ref 0.2–1.2)
Total Protein: 6.5 g/dL (ref 6.0–8.3)

## 2014-03-10 LAB — CBC
HCT: 42.5 % (ref 39.0–52.0)
Hemoglobin: 14.2 g/dL (ref 13.0–17.0)
MCH: 31.7 pg (ref 26.0–34.0)
MCHC: 33.4 g/dL (ref 30.0–36.0)
MCV: 94.9 fL (ref 78.0–100.0)
MPV: 10.5 fL (ref 9.4–12.4)
Platelets: 200 10*3/uL (ref 150–400)
RBC: 4.48 MIL/uL (ref 4.22–5.81)
RDW: 13.3 % (ref 11.5–15.5)
WBC: 3.9 10*3/uL — ABNORMAL LOW (ref 4.0–10.5)

## 2014-03-10 NOTE — Progress Notes (Signed)
Chief Complaint:  Chief Complaint  Patient presents with  . Annual Exam    HPI: Christopher Hendricks is a 68 y.o. male who is here for annual, he is healthy and doing well.  He is seasonal accountant, semi retired.  He had a problem with ASA in the past and GIB  Which required transfusion Married, 1 daughter 10 yo IDDM, 2 grandchildren Colonoscopy 2007 and also again  in the last year.  Last dT: 2011 UTD on pneumococcal vaccine  Past Medical History  Diagnosis Date  . Sigmoid diverticulosis 2007  . Alcoholism /alcohol abuse   . Duodenal ulcer   . Arthritis   . Heart murmur    Past Surgical History  Procedure Laterality Date  . Hernia repair    . Colonoscopy  2007    sigmoid diverticulosis.  Dr Fuller Plan  . Esophagogastroduodenoscopy N/A 08/18/2012    Procedure: ESOPHAGOGASTRODUODENOSCOPY (EGD);  Surgeon: Irene Shipper, MD;  Location: [facility] END[facility]COPY;  Service: Endoscopy;  Laterality: N/A;  . Vasectomy    . Small intestine surgery     History   Social History  . Marital Status: Married    Spouse Name: N/A    Number of Children: N/A  . Years of Education: N/A   Occupational History  . CPA    Social History Main Topics  . Smoking status: Never Smoker   . Smokeless tobacco: None  . Alcohol Use: Yes     Comment: 1 quart of day  . Drug Use: No  . Sexual Activity: Yes     Comment: number of sex partners in the last 3 months  1 ([name])   Other Topics Concern  . None   Social History Narrative   Exercise walking 2 times a week for 1 mile   Married   Charity fundraiser   Family History  Problem Relation Age of Onset  . Diabetes Daughter    Allergies  Allergen Reactions  . Codeine Nausea And Vomiting  . Sulfa Antibiotics Rash   Prior to Admission medications   Medication Sig Start Date End Date Taking? Authorizing Provider  Acetaminophen (TYLENOL PO) Take by mouth.   Yes Historical Provider, MD  DiphenhydrAMINE HCl (BENADRYL PO) Take by mouth.   Yes Historical  Provider, MD  Multiple Vitamins-Minerals (MULTIVITAMIN PO) Take by mouth. chewable   Yes Historical Provider, MD  OVER THE COUNTER MEDICATION Sleep aide   Yes Historical Provider, MD  pantoprazole (PROTONIX) 40 MG tablet Take 1 tablet (40 mg total) by mouth 2 (two) times daily with a meal. Patient not taking: Reported on 03/10/2014 08/20/12   Jonetta Osgood, MD     ROS: The patient denies fevers, chills, night sweats, unintentional weight loss, chest pain, palpitations, wheezing, dyspnea on exertion, nausea, vomiting, abdominal pain, dysuria, hematuria, melena, numbness, weakness, or tingling.   All other systems have been reviewed and were otherwise negative with the exception of those mentioned in the HPI and as above.    PHYSICAL EXAM: Filed Vitals:   03/10/14 1133  BP: 116/72  Pulse: 60  Temp: 97.2 F (36.2 C)  Resp: 16   Filed Vitals:   03/10/14 1133  Height: 5' 11.5" (1.816 m)  Weight: 150 lb 12.8 oz (68.402 kg)   Body mass index is 20.74 kg/(m^2).  General: Alert, no acute distress HEENT:  Normocephalic, atraumatic, oropharynx patent. EOMI, PERRLA Cardiovascular:  Regular rate and rhythm, no rubs murmurs or gallops.  No Carotid bruits,  radial pulse intact. No pedal edema.  Respiratory: Clear to auscultation bilaterally.  No wheezes, rales, or rhonchi.  No cyanosis, no use of accessory musculature GI: No organomegaly, abdomen is soft and non-tender, positive bowel sounds.  No masses. Skin: No rashes. Neurologic: Facial musculature symmetric. Psychiatric: Patient is appropriate throughout our interaction. Lymphatic: No cervical lymphadenopathy Musculoskeletal: Gait intact. GU -testicles nl, no hernia,s rashes, amsses, circumices Prostate normal   LABS: Results for orders placed or performed in visit on 04/13/13  POCT Influenza A/B  Result Value Ref Range   Influenza A, POC Negative    Influenza B, POC Negative   POCT CBC  Result Value Ref Range   WBC 9.1 4.6 -  10.2 K/uL   Lymph, poc 1.5 0.6 - 3.4   POC LYMPH PERCENT 16.2 10 - 50 %L   MID (cbc) 1.1 (A) 0 - 0.9   POC MID % 12.2 (A) 0 - 12 %M   POC Granulocyte 6.5 2 - 6.9   Granulocyte percent 71.6 37 - 80 %G   RBC 4.81 4.69 - 6.13 M/uL   Hemoglobin 14.3 14.1 - 18.1 g/dL   HCT, POC 45.8 43.5 - 53.7 %   MCV 95.3 80 - 97 fL   MCH, POC 29.7 27 - 31.2 pg   MCHC 31.2 (A) 31.8 - 35.4 g/dL   RDW, POC 15.9 %   Platelet Count, POC 220 142 - 424 K/uL   MPV 9.7 0 - 99.8 fL     EKG/XRAY:   Primary read interpreted by Dr. Marin Comment at [facility].   ASSESSMENT/PLAN: Encounter Diagnoses  Name Primary?  . Medicare annual wellness visit, subsequent Yes  . Screening for deficiency anemia   . Screening for hyperlipidemia   . Screening for thyroid disorder   . Screening for prostate cancer   . Special screening for malignant neoplasms, colon   . H/O: duodenal ulcer   . Flu vaccine need    Flu vaccine given  UTD on colonscopy  UTD on vaccines Annual labs F/u in 1 year  Gross sideeffects, risk and benefits, and alternatives of medications d/w patient. Patient is aware that all medications have potential sideeffects and we are unable to predict every sideeffect or drug-drug interaction that may occur.  Bennett Ram, [place], DO 03/10/2014 12:18 PM

## 2014-03-11 LAB — PSA: PSA: 1.08 ng/mL (ref <=4.00)

## 2014-03-12 LAB — IFOBT (OCCULT BLOOD): IFOBT: NEGATIVE

## 2014-03-21 ENCOUNTER — Encounter: Payer: Self-pay | Admitting: Family Medicine

## 2015-03-01 ENCOUNTER — Encounter: Payer: Self-pay | Admitting: Urgent Care

## 2015-03-01 ENCOUNTER — Ambulatory Visit (INDEPENDENT_AMBULATORY_CARE_PROVIDER_SITE_OTHER): Payer: PPO | Admitting: Urgent Care

## 2015-03-01 VITALS — BP 130/74 | HR 65 | Temp 98.1°F | Resp 16 | Ht 71.5 in | Wt 155.0 lb

## 2015-03-01 DIAGNOSIS — Z23 Encounter for immunization: Secondary | ICD-10-CM | POA: Diagnosis not present

## 2015-03-01 DIAGNOSIS — L989 Disorder of the skin and subcutaneous tissue, unspecified: Secondary | ICD-10-CM | POA: Diagnosis not present

## 2015-03-01 DIAGNOSIS — F1010 Alcohol abuse, uncomplicated: Secondary | ICD-10-CM | POA: Diagnosis not present

## 2015-03-01 DIAGNOSIS — Z0000 Encounter for general adult medical examination without abnormal findings: Secondary | ICD-10-CM | POA: Diagnosis not present

## 2015-03-01 LAB — COMPREHENSIVE METABOLIC PANEL
ALBUMIN: 4.3 g/dL (ref 3.6–5.1)
ALT: 30 U/L (ref 9–46)
AST: 30 U/L (ref 10–35)
Alkaline Phosphatase: 75 U/L (ref 40–115)
BUN: 10 mg/dL (ref 7–25)
CO2: 29 mmol/L (ref 20–31)
Calcium: 9.1 mg/dL (ref 8.6–10.3)
Chloride: 101 mmol/L (ref 98–110)
Creat: 0.68 mg/dL — ABNORMAL LOW (ref 0.70–1.25)
Glucose, Bld: 61 mg/dL — ABNORMAL LOW (ref 65–99)
Potassium: 4 mmol/L (ref 3.5–5.3)
Sodium: 140 mmol/L (ref 135–146)
Total Bilirubin: 1.3 mg/dL — ABNORMAL HIGH (ref 0.2–1.2)
Total Protein: 6.8 g/dL (ref 6.1–8.1)

## 2015-03-01 LAB — CBC
HCT: 42.7 % (ref 39.0–52.0)
Hemoglobin: 14.6 g/dL (ref 13.0–17.0)
MCH: 32.8 pg (ref 26.0–34.0)
MCHC: 34.2 g/dL (ref 30.0–36.0)
MCV: 96 fL (ref 78.0–100.0)
MPV: 10.4 fL (ref 8.6–12.4)
Platelets: 204 10*3/uL (ref 150–400)
RBC: 4.45 MIL/uL (ref 4.22–5.81)
RDW: 13.4 % (ref 11.5–15.5)
WBC: 3.7 10*3/uL — AB (ref 4.0–10.5)

## 2015-03-01 LAB — LIPID PANEL
Cholesterol: 184 mg/dL (ref 125–200)
HDL: 62 mg/dL (ref >=40)
LDL Cholesterol: 103 mg/dL (ref <130)
Total CHOL/HDL Ratio: 3 Ratio (ref <=5.0)
Triglycerides: 95 mg/dL (ref <150)
VLDL: 19 mg/dL (ref <30)

## 2015-03-01 LAB — TSH: TSH: 1.113 u[IU]/mL (ref 0.350–4.500)

## 2015-03-01 NOTE — Progress Notes (Signed)
MRN: [id number]  Subjective:   Christopher Hendricks is a 69 y.o. male presenting for annual physical exam.  Medical care team includes: PCP: No PCP Per Patient Vision: Had an eye exam ~1.5 years ago. Does not need corrective lenses. Specialists: Dr. Fuller Plan and Dr. Henrene Pastor, treated patient for PUD due to NSAID use, colonoscopy done 6 years ago. He is due for follow up/repeat colonoscopy in 1 year.  [name] has GI bleed; Anemia; Alcoholism ([place]); Duodenal ulcer with hemorrhage; and Acute posthemorrhagic anemia on his problem list.  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 He plans on retiring in 1-2 years. Denies smoking cigarettes but drinks heavily, ~3-4 full glasses of wine daily. Patient has been told multiple times before that he needs to cut back but he has not been willing to. States that he is "afraid he will not be able to" and is not interested in trying at the moment. He denies history of liver disease, cirrhosis. Patient is adopted and does not know family history. He does report that over the years he has decreased the amount of alcohol he has drank because he notices that it has been easier for him to get headaches. He also has a history of PUD, treated ~1 year ago, he no longer takes NSAIDs the way he used to. Otherwise, patient tries to eat healthy and exercises some with walks.  Christopher Hendricks has a current medication list which includes the following prescription(s): acetaminophen, diphenhydramine hcl, multiple vitamins-minerals, and OVER THE COUNTER MEDICATION. He is allergic to codeine and sulfa antibiotics.  Christopher Hendricks  has a past medical history of Sigmoid diverticulosis (2007); Alcoholism /alcohol abuse ([place]); Duodenal ulcer; Arthritis; Heart murmur; and Blood transfusion without reported diagnosis. Also  has past surgical history that includes Hernia repair; Colonoscopy (2007); Esophagogastroduodenoscopy (N/A, 08/18/2012); Vasectomy; and Small intestine  surgery.  family history includes Diabetes in his daughter.  Immunizations: TDAP 2012, Shingles vaccination 11/2012, Pneumococcal 23 08/2012. Needs flu and Prevnar 13 today.   ROS   Objective:   Vitals: BP 130/74 mmHg  Pulse 65  Temp(Src) 98.1 F (36.7 C) (Oral)  Resp 16  Ht 5' 11.5" (1.816 m)  Wt 155 lb (70.308 kg)  BMI 21.32 kg/m2  SpO2 100%  Physical Exam  Constitutional: He is oriented to person, place, and time. He appears well-developed and well-nourished.  HENT:  Head:    TM's intact bilaterally, no effusions or erythema. Nasal turbinates pink and moist. No sinus tenderness. Postnasal drip present as cobblestoning, without oropharyngeal exudates, erythema or abscesses.  Eyes: Conjunctivae and EOM are normal. Pupils are equal, round, and reactive to light. Right eye exhibits no discharge. Left eye exhibits no discharge. No scleral icterus.  Neck: Normal range of motion. Neck supple. No thyromegaly present.  Cardiovascular: Normal rate, regular rhythm and intact distal pulses.  Exam reveals no gallop and no friction rub.   No murmur heard. Pulmonary/Chest: No stridor. No respiratory distress. He has no wheezes. He has no rales.  Abdominal: Soft. Bowel sounds are normal. He exhibits no distension and no mass. There is no tenderness.  Musculoskeletal: Normal range of motion. He exhibits no edema or tenderness.  Lymphadenopathy:    He has no cervical adenopathy.  Neurological: He is alert and oriented to person, place, and time.  Skin: Skin is warm and dry. No rash noted. No erythema. No pallor.  Has multiple brown papular growths and spotting throughout his body and scalp. All are non-tender,  not draining and range in size from <0.5cm - 1.5cm.  Psychiatric: He has a normal mood and affect.   PROCEDURE NOTE: Punch biopsy Verbal consent obtain from the patient.  Skin cleansed with alcohol pad, then local anesthesia with 1 cc 2% lidocaine with epinephrine.   A 36mm punch  biopsy performed and specimen sent for pathology review. Hemostasis achieved with silver nitrate and pressure. Bandage applied.  Local wound care reviewed.  Assessment and Plan :   1. Annual physical exam - Discussed healthy lifestyle, diet, exercise, preventative care, vaccinations, and addressed patient's concerns.  - Patient is medically stable - I suggested that he try using oral antihistamine if he has any allergies which he asked about  2. Alcohol abuse - Counseled on cutting back and alcoholism, risks and complications of liver disease, malnutrition. Patient freely admits that he knows he drinks too much but is not prepared to start cutting back. Patient states he will let me know if he needs help with this.  3. Need for prophylactic vaccination and inoculation against influenza - Flu Vaccine QUAD 36+ mos IM  4. Need for prophylactic vaccination against Streptococcus pneumoniae (pneumococcus) - Pneumococcal conjugate vaccine 13-valent IM  5. Skin lesion of scalp - Likely has seborrheic keratosis but one lesion may be basal cell carcinoma, biopsy performed today. Consider referral to dermatology.  - Dermatology pathology   Jaynee Eagles, PA-C [facility] [phone] 03/01/2015  2:08 PM

## 2015-03-01 NOTE — Patient Instructions (Signed)
Keeping you healthy  Get these tests  Blood pressure- Have your blood pressure checked once a year by your healthcare provider.  Normal blood pressure is 120/80  Weight- Have your body mass index (BMI) calculated to screen for obesity.  BMI is a measure of body fat based on height and weight. You can also calculate your own BMI at [employer].[email or web address].  Cholesterol- Have your cholesterol checked every year.  Diabetes- Have your blood sugar checked regularly if you have high blood pressure, high cholesterol, have a family history of diabetes or if you are overweight.  Screening for Colon Cancer- Colonoscopy starting at age 55.  Screening may begin sooner depending on your family history and other health conditions. Follow up colonoscopy as directed by your Gastroenterologist.  Screening for Prostate Cancer- Both blood work (PSA) and a rectal exam help screen for Prostate Cancer.  Screening begins at age 40 with African-American men and at age 70 with Caucasian men.  Screening may begin sooner depending on your family history.  Take these medicines  Aspirin- One aspirin daily can help prevent Heart disease and Stroke.  Flu shot- Every fall.  Tetanus- Every 10 years.  Zostavax- Once after the age of 3 to prevent Shingles.  Pneumonia shot- Once after the age of 72; if you are younger than 48, ask your healthcare provider if you need a Pneumonia shot.  Take these steps  Don't smoke- If you do smoke, talk to your doctor about quitting.  For tips on how to quit, go to www.smokefree.gov or call 1-800-QUIT-NOW.  Be physically active- Exercise 5 days a week for at least 30 minutes.  If you are not already physically active start slow and gradually work up to 30 minutes of moderate physical activity.  Examples of moderate activity include walking briskly, mowing the yard, dancing, swimming, bicycling, etc.  Eat a healthy diet- Eat a variety of healthy food such as fruits, vegetables, low  fat milk, low fat cheese, yogurt, lean meant, poultry, fish, beans, tofu, etc. For more information go to www.thenutritionsource.org  Drink alcohol in moderation- Limit alcohol intake to less than two drinks a day. Never drink and drive.  Dentist- Brush and floss twice daily; visit your dentist twice a year.  Depression- Your emotional health is as important as your physical health. If you're feeling down, or losing interest in things you would normally enjoy please talk to your healthcare provider.  Eye exam- Visit your eye doctor every year.  Safe sex- If you may be exposed to a sexually transmitted infection, use a condom.  Seat belts- Seat belts can save your life; always wear one.  Smoke/Carbon Monoxide detectors- These detectors need to be installed on the appropriate level of your home.  Replace batteries at least once a year.  Skin cancer- When out in the sun, cover up and use sunscreen 15 SPF or higher.  Violence- If anyone is threatening you, please tell your healthcare provider.  Living Will/ Health care power of attorney- Speak with your healthcare provider and family.   Seborrheic Keratosis Seborrheic keratosis is a common, noncancerous (benign) skin growth. This condition causes waxy, rough, tan, brown, or black spots to appear on the skin. These skin growths can be flat or raised. CAUSES The cause of this condition is not known. RISK FACTORS This condition is more likely to develop in:  People who have a family history of seborrheic keratosis.  People who are 40 or older.  People who are pregnant.  People  who have had estrogen replacement therapy. SYMPTOMS This condition often occurs on the face, chest, shoulders, back, or other areas. These growths:  Are usually painless, but may become irritated and itchy.  Can be yellow, brown, black, or other colors.  Are slightly raised or have a flat surface.  Are sometimes rough or wart-like in texture.  Are often  waxy on the surface.  Are round or oval-shaped.  Sometimes look like they are "stuck on."  Often occur in groups, but may occur as a single growth. DIAGNOSIS This condition is diagnosed with a medical history and physical exam. A sample of the growth may be tested (skin biopsy). You may need to see a skin specialist (dermatologist). TREATMENT Treatment is not usually needed for this condition, unless the growths are irritated or are often bleeding. You may also choose to have the growths removed if you do not like their appearance. Most commonly, these growths are treated with a procedure in which liquid nitrogen is applied to "freeze" off the growth (cryosurgery). They may also be burned off with electricity or cut off. HOME CARE INSTRUCTIONS  Watch your growth for any changes.  Keep all follow-up visits as told by your health care provider. This is important.  Do not scratch or pick at the growth or growths. This can cause them to become irritated or infected. SEEK MEDICAL CARE IF:  You suddenly have many new growths.  Your growth bleeds, itches, or hurts.  Your growth suddenly becomes larger or changes color.   This information is not intended to replace advice given to you by your health care provider. Make sure you discuss any questions you have with your health care provider.   Document Released: 05/03/2010 Document Revised: 12/20/2014 Document Reviewed: 08/16/2014 Elsevier Interactive Patient Education [employer].

## 2015-03-05 ENCOUNTER — Encounter: Payer: Self-pay | Admitting: Urgent Care

## 2015-12-25 ENCOUNTER — Encounter: Payer: Self-pay | Admitting: Gastroenterology

## 2016-01-03 ENCOUNTER — Encounter: Payer: Self-pay | Admitting: Gastroenterology

## 2016-02-18 ENCOUNTER — Ambulatory Visit (AMBULATORY_SURGERY_CENTER): Payer: Self-pay | Admitting: *Deleted

## 2016-02-18 VITALS — Ht 71.5 in | Wt 154.8 lb

## 2016-02-18 DIAGNOSIS — Z1211 Encounter for screening for malignant neoplasm of colon: Secondary | ICD-10-CM

## 2016-02-18 MED ORDER — NA SULFATE-K SULFATE-MG SULF 17.5-3.13-1.6 GM/177ML PO SOLN: 1.0000 | Freq: Once | ORAL | 0 refills | Status: AC

## 2016-02-18 NOTE — Progress Notes (Signed)
No egg or soy allergy known to patient  No issues with past sedation with any surgeries  or procedures, no intubation problems  No diet pills per patient No home 02 use per patient  No blood thinners per patient  Pt denies issues with constipation  No A fib or A flutter   

## 2016-02-19 ENCOUNTER — Encounter: Payer: Self-pay | Admitting: Gastroenterology

## 2016-03-03 ENCOUNTER — Encounter: Payer: Self-pay | Admitting: Gastroenterology

## 2016-03-03 ENCOUNTER — Ambulatory Visit (AMBULATORY_SURGERY_CENTER): Payer: PPO | Admitting: Gastroenterology

## 2016-03-03 VITALS — BP 127/82 | HR 68 | Temp 96.8°F | Resp 10 | Ht 71.0 in | Wt 154.0 lb

## 2016-03-03 DIAGNOSIS — D120 Benign neoplasm of cecum: Secondary | ICD-10-CM

## 2016-03-03 DIAGNOSIS — Z1212 Encounter for screening for malignant neoplasm of rectum: Secondary | ICD-10-CM

## 2016-03-03 DIAGNOSIS — Z1211 Encounter for screening for malignant neoplasm of colon: Secondary | ICD-10-CM | POA: Diagnosis not present

## 2016-03-03 MED ORDER — SODIUM CHLORIDE 0.9 % IV SOLN: 500.0000 mL | INTRAVENOUS | Status: DC

## 2016-03-03 NOTE — Patient Instructions (Signed)
Impressions/Recommendations:  Polyp handout given to patient. Diverticulosis handout given to patient. Hemorrhoid handout given to patient.  Repeat colonoscopy in 5-10 years based on pathology report.  YOU HAD AN ENDOSCOPIC PROCEDURE TODAY AT [facility]:   Refer to the procedure report that was given to you for any specific questions about what was found during the examination.  If the procedure report does not answer your questions, please call your gastroenterologist to clarify.  If you requested that your care partner not be given the details of your procedure findings, then the procedure report has been included in a sealed envelope for you to review at your convenience later.  YOU SHOULD EXPECT: Some feelings of bloating in the abdomen. Passage of more gas than usual.  Walking can help get rid of the air that was put into your GI tract during the procedure and reduce the bloating. If you had a lower endoscopy (such as a colonoscopy or flexible sigmoidoscopy) you may notice spotting of blood in your stool or on the toilet paper. If you underwent a bowel prep for your procedure, you may not have a normal bowel movement for a few days.  Please Note:  You might notice some irritation and congestion in your nose or some drainage.  This is from the oxygen used during your procedure.  There is no need for concern and it should clear up in a day or so.  SYMPTOMS TO REPORT IMMEDIATELY:   Following lower endoscopy (colonoscopy or flexible sigmoidoscopy):  Excessive amounts of blood in the stool  Significant tenderness or worsening of abdominal pains  Swelling of the abdomen that is new, acute  Fever of 100F or higher  For urgent or emergent issues, a gastroenterologist can be reached at any hour by calling [phone].   DIET:  We do recommend a small meal at first, but then you may proceed to your regular diet.  Drink plenty of fluids but you should avoid alcoholic  beverages for 24 hours.  ACTIVITY:  You should plan to take it easy for the rest of today and you should NOT DRIVE or use heavy machinery until tomorrow (because of the sedation medicines used during the test).    FOLLOW UP: Our staff will call the number listed on your records the next business day following your procedure to check on you and address any questions or concerns that you may have regarding the information given to you following your procedure. If we do not reach you, we will leave a message.  However, if you are feeling well and you are not experiencing any problems, there is no need to return our call.  We will assume that you have returned to your regular daily activities without incident.  If any biopsies were taken you will be contacted by phone or by letter within the next 1-3 weeks.  Please call us at [phone] if you have not heard about the biopsies in 3 weeks.    SIGNATURES/CONFIDENTIALITY: You and/or your care partner have signed paperwork which will be entered into your electronic medical record.  These signatures attest to the fact that that the information above on your After Visit Summary has been reviewed and is understood.  Full responsibility of the confidentiality of this discharge information lies with you and/or your care-partner.

## 2016-03-03 NOTE — Progress Notes (Signed)
Report to PACU, RN, vss, BBS= Clear.  

## 2016-03-03 NOTE — Op Note (Signed)
[place] Patient Name: Christopher Hendricks Procedure Date: 03/03/2016 10:19 AM MRN: [id number] Endoscopist: Ladene Artist , MD Age: 70 Referring MD:  Date of Birth: 01/25/1946 Gender: Male Account #: [id number] Procedure:                Colonoscopy Indications:              Screening for colorectal malignant neoplasm Medicines:                Monitored Anesthesia Care Procedure:                Pre-Anesthesia Assessment:                           - Prior to the procedure, a History and Physical                            was performed, and patient medications and                            allergies were reviewed. The patient's tolerance of                            previous anesthesia was also reviewed. The risks                            and benefits of the procedure and the sedation                            options and risks were discussed with the patient.                            All questions were answered, and informed consent                            was obtained. Prior Anticoagulants: The patient has                            taken no previous anticoagulant or antiplatelet                            agents. ASA Grade Assessment: I - A normal, healthy                            patient. After reviewing the risks and benefits,                            the patient was deemed in satisfactory condition to                            undergo the procedure.                           After obtaining informed consent, the colonoscope  was passed under direct vision. Throughout the                            procedure, the patient's blood pressure, pulse, and                            oxygen saturations were monitored continuously. The                            Model PCF-[id number]) scope was introduced                            through the anus and advanced to the the cecum,                            identified by  appendiceal orifice and ileocecal                            valve. The ileocecal valve, appendiceal orifice,                            and rectum were photographed. The quality of the                            bowel preparation was good. The colonoscopy was                            performed without difficulty. The patient tolerated                            the procedure well. Scope In: 10:30:46 AM Scope Out: 10:43:40 AM Scope Withdrawal Time: 0 hours 9 minutes 47 seconds  Total Procedure Duration: 0 hours 12 minutes 54 seconds  Findings:                 The perianal and digital rectal examinations were                            normal.                           A 8 mm polyp was found in the cecum. The polyp was                            sessile. The polyp was removed with a cold snare.                            Resection and retrieval were complete.                           Three sessile polyps were found in the cecum. The                            polyps were 3 to 4 mm in size. These polyps  were                            removed with a cold biopsy forceps. Resection and                            retrieval were complete.                           A few medium-mouthed diverticula were found in the                            sigmoid colon.                           Internal hemorrhoids were found during                            retroflexion. The hemorrhoids were medium-sized and                            Grade I (internal hemorrhoids that do not prolapse).                           The exam was otherwise without abnormality on                            direct and retroflexion views. Complications:            No immediate complications. Estimated blood loss:                            None. Estimated Blood Loss:     Estimated blood loss: none. Impression:               - One 8 mm polyp in the cecum, removed with a cold                            snare. Resected and  retrieved.                           - Three 3 to 4 mm polyps in the cecum, removed with                            a cold biopsy forceps. Resected and retrieved.                           - Diverticulosis in the sigmoid colon.                           - Internal hemorrhoids.                           - The examination was otherwise normal on direct  and retroflexion views. Recommendation:           - Repeat colonoscopy in 5 years for surveillance if                            polyp(s) are precancerous, otherwise 10 years.                           - Patient has a contact number available for                            emergencies. The signs and symptoms of potential                            delayed complications were discussed with the                            patient. Return to normal activities tomorrow.                            Written discharge instructions were provided to the                            patient.                           - Resume previous diet.                           - Continue present medications.                           - Await pathology results. Ladene Artist, MD 03/03/2016 10:48:13 AM This report has been signed electronically.

## 2016-03-04 ENCOUNTER — Telehealth: Payer: Self-pay

## 2016-03-04 NOTE — Telephone Encounter (Signed)
  Follow up Call-  Call back number 03/03/2016  Post procedure Call Back phone  # [phone]  Permission to leave phone message Yes  Some recent data might be hidden     Patient questions:  Do you have a fever, pain , or abdominal swelling? No. Pain Score  0 *  Have you tolerated food without any problems? Yes.    Have you been able to return to your normal activities? Yes.    Do you have any questions about your discharge instructions: Diet   No. Medications  No. Follow up visit  No.  Do you have questions or concerns about your Care? No.  Actions: * If pain score is 4 or above: No action needed, pain <4.

## 2016-03-13 ENCOUNTER — Encounter: Payer: Self-pay | Admitting: Family Medicine

## 2016-03-13 ENCOUNTER — Encounter: Payer: Self-pay | Admitting: Gastroenterology

## 2016-03-13 ENCOUNTER — Ambulatory Visit (INDEPENDENT_AMBULATORY_CARE_PROVIDER_SITE_OTHER): Payer: PPO | Admitting: Family Medicine

## 2016-03-13 VITALS — BP 124/82 | HR 100 | Temp 97.6°F | Resp 16 | Ht 71.0 in | Wt 152.6 lb

## 2016-03-13 DIAGNOSIS — Z8719 Personal history of other diseases of the digestive system: Secondary | ICD-10-CM

## 2016-03-13 DIAGNOSIS — Z0000 Encounter for general adult medical examination without abnormal findings: Secondary | ICD-10-CM

## 2016-03-13 DIAGNOSIS — Z1159 Encounter for screening for other viral diseases: Secondary | ICD-10-CM

## 2016-03-13 DIAGNOSIS — K279 Peptic ulcer, site unspecified, unspecified as acute or chronic, without hemorrhage or perforation: Secondary | ICD-10-CM

## 2016-03-13 DIAGNOSIS — L570 Actinic keratosis: Secondary | ICD-10-CM | POA: Diagnosis not present

## 2016-03-13 DIAGNOSIS — Z13220 Encounter for screening for lipoid disorders: Secondary | ICD-10-CM | POA: Diagnosis not present

## 2016-03-13 DIAGNOSIS — F1010 Alcohol abuse, uncomplicated: Secondary | ICD-10-CM | POA: Diagnosis not present

## 2016-03-13 DIAGNOSIS — Z23 Encounter for immunization: Secondary | ICD-10-CM

## 2016-03-13 DIAGNOSIS — Z125 Encounter for screening for malignant neoplasm of prostate: Secondary | ICD-10-CM

## 2016-03-13 DIAGNOSIS — D229 Melanocytic nevi, unspecified: Secondary | ICD-10-CM | POA: Diagnosis not present

## 2016-03-13 LAB — CBC WITH DIFFERENTIAL/PLATELET
Basophils Absolute: 35 {cells}/uL (ref 0–200)
Basophils Relative: 1 %
Eosinophils Absolute: 70 {cells}/uL (ref 15–500)
Eosinophils Relative: 2 %
HCT: 46 % (ref 38.5–50.0)
Hemoglobin: 15.1 g/dL (ref 13.2–17.1)
Lymphocytes Relative: 27 %
Lymphs Abs: 945 {cells}/uL (ref 850–3900)
MCH: 32.3 pg (ref 27.0–33.0)
MCHC: 32.8 g/dL (ref 32.0–36.0)
MCV: 98.5 fL (ref 80.0–100.0)
MPV: 10.9 fL (ref 7.5–12.5)
Monocytes Absolute: 490 {cells}/uL (ref 200–950)
Monocytes Relative: 14 %
Neutro Abs: 1960 {cells}/uL (ref 1500–7800)
Neutrophils Relative %: 56 %
Platelets: 223 10*3/uL (ref 140–400)
RBC: 4.67 MIL/uL (ref 4.20–5.80)
RDW: 13 % (ref 11.0–15.0)
WBC: 3.5 10*3/uL — ABNORMAL LOW (ref 3.8–10.8)

## 2016-03-13 LAB — LIPID PANEL
Cholesterol: 204 mg/dL — ABNORMAL HIGH
HDL: 68 mg/dL
LDL Cholesterol: 116 mg/dL — ABNORMAL HIGH
Total CHOL/HDL Ratio: 3 ratio
Triglycerides: 100 mg/dL
VLDL: 20 mg/dL

## 2016-03-13 LAB — COMPLETE METABOLIC PANEL WITHOUT GFR
ALT: 32 U/L (ref 9–46)
AST: 31 U/L (ref 10–35)
Albumin: 4.4 g/dL (ref 3.6–5.1)
Alkaline Phosphatase: 79 U/L (ref 40–115)
BUN: 10 mg/dL (ref 7–25)
CO2: 30 mmol/L (ref 20–31)
Calcium: 9.1 mg/dL (ref 8.6–10.3)
Chloride: 104 mmol/L (ref 98–110)
Creat: 0.79 mg/dL (ref 0.70–1.25)
GFR, Est African American: >89 mL/min
GFR, Est Non African American: >89 mL/min
Glucose, Bld: 79 mg/dL (ref 65–99)
Potassium: 4.5 mmol/L (ref 3.5–5.3)
Sodium: 142 mmol/L (ref 135–146)
Total Bilirubin: 0.8 mg/dL (ref 0.2–1.2)
Total Protein: 6.7 g/dL (ref 6.1–8.1)

## 2016-03-13 LAB — HEPATITIS C ANTIBODY: HCV Ab: NEGATIVE

## 2016-03-13 NOTE — Progress Notes (Signed)
By signing my name below, I, Christopher Hendricks, attest that this documentation has been prepared under the direction and in the presence of Christopher Ray, MD.  Electronically Signed: Verlee Hendricks, Medical Scribe. 03/13/16. 11:28 AM.  Subjective:    Patient ID: Christopher Hendricks, male    DOB: Aug 14, 1945, 70 y.o.   MRN: [id number]  HPI Chief Complaint  Patient presents with  . Annual Exam    HPI Comments: Christopher Hendricks is a 70 y.o. male who presents to the [facility] for a wellness exam. Last physical by Jaynee Eagles, PA-C, 02/2015. Pt has fasting blood work.  Peptic Ulcer Disease: Had a bleeding duodenal ulcer requiring hospitalization in 2014. It was recommended he is to be on life long PPI, and advised to be on decreased NSAID use. Pt has not been taking a PPI, and he stopped taking ASA.  Alcohol Abuse/Over Use: This was discussed at physical last November. At that time he was drinking 3-4 glasses of wine daily. Had decreased intake over the years, he was counseled on cutting back and risk of liver disease. Advised to let us know if he needed help in doing so. Drinks 5-6 glasses of wine at night beginning at 6pm. He has always fett a little guilty about his long term health when drinking, and he hasn't seriously cut back with drinking. He's afraid if he tried to quit drinking he wouldn't succeed and suspects he's his worse critic. He notes drinking is an escape. Pt does not smoke. Denies receiving DUIs, drinking effecting family, drinking effecting work, and eye opener drink in the morning.  Cancer Screening: Prostate CA: Would like to get prostate testing today. Denies prostate issues in the past. Lab Results  Component Value Date   PSA 1.08 03/10/2014   PSA 0.65 09/08/2012  Colon CA: Colonoscopy 03/03/16; diverticulosis, internal hemorrhoids, and a few polyps. Repeat in 5 years as 4 tubular adenomas found. Previously seen by Dr. Fuller Plan, and Dr. Henrene Pastor. Skin  CA: Had a lesion removed at last physical that showed actinic keratosis. They discussed a referral to dermatology, or to return PRN. Reports spot on the center of his left back that's been there for a while and notes darkening mole on the inside of his left nose for the past couple of years. He states the mole removed from last year didn't grow back. He hasn't seen a dermatologist in years. FHx: Unknown since he was adopted.  Immunizations: Recalls receiving the shingle vaccine, and PNA vaccine in the past.  Immunization History  Administered Date(s) Administered  . Influenza Split 02/24/2012  . Influenza,inj,Quad PF,36+ Mos 03/01/2013, 03/10/2014, 03/01/2015, 03/13/2016  . Pneumococcal Conjugate-13 03/01/2015  . Pneumococcal Polysaccharide-23 08/18/2012   Vision: Followed by an ophthalmologist.  Visual Acuity Screening   Right eye Left eye Both eyes  Without correction: 20/20 20/40 20/15   With correction:      Dentist: Followed by a dentist.  [place]. He still does his own yard work.  Hep C/HIV Screening: Has not been performed.  Advance Directives: He has a [place] and living will.  Functional Status Survey: Is the patient deaf or have difficulty hearing?: No Does the patient have difficulty concentrating, remembering, or making decisions?: No Does the patient have difficulty walking or climbing stairs?: No Does the patient have difficulty dressing or bathing?: No Does the patient have difficulty doing errands alone such as  visiting a doctor's office or shopping?: No  Fall Screening: Fall Risk  03/13/2016 03/10/2014  Falls in the past year? No No   Depression Screening: Depression screen [facility] 2/9 03/13/2016 03/01/2015 03/10/2014  Decreased Interest 0 0 0  Down, Depressed, Hopeless 0 0 0  PHQ - 2 Score 0 0 0   Patient Active Problem List   Diagnosis Date Noted  . Duodenal ulcer with hemorrhage  08/18/2012  . Acute posthemorrhagic anemia 08/18/2012  . GI bleed 08/17/2012  . Anemia 08/17/2012  . Alcohol abuse 08/17/2012   Past Medical History:  Diagnosis Date  . Alcoholism /alcohol abuse ([place])   . Allergy   . Arthritis   . Blood transfusion without reported diagnosis   . Duodenal ulcer   . Heart murmur    as teen  . Neuromuscular disorder ([place])    pinched nerve in neck that causes numbness in fingers on right hand   . Sigmoid diverticulosis 2007   Past Surgical History:  Procedure Laterality Date  . COLONOSCOPY  2007   sigmoid diverticulosis.  Dr Fuller Plan  . ESOPHAGOGASTRODUODENOSCOPY N/A 08/18/2012   Procedure: ESOPHAGOGASTRODUODENOSCOPY (EGD);  Surgeon: Irene Shipper, MD;  Location: [facility] ENDOSCOPY;  Service: Endoscopy;  Laterality: N/A;  . HERNIA REPAIR    . SMALL INTESTINE SURGERY    . UPPER GASTROINTESTINAL ENDOSCOPY    . VASECTOMY     Allergies  Allergen Reactions  . Codeine Nausea And Vomiting  . Sulfa Antibiotics Rash   Prior to Admission medications   Medication Sig Start Date End Date Taking? Authorizing Provider  Acetaminophen (TYLENOL PO) Take by mouth.    Historical Provider, MD  Biotin (BIOTIN 5000) 5 MG CAPS Take 5,000 mg by mouth daily.    Historical Provider, MD  DiphenhydrAMINE HCl (BENADRYL PO) Take by mouth.    Historical Provider, MD  glucosamine-chondroitin 500-400 MG tablet Take 1 tablet by mouth daily.    Historical Provider, MD  Magnesium 500 MG CAPS Take by mouth daily.    Historical Provider, MD  Multiple Vitamins-Minerals (MULTIVITAMIN PO) Take by mouth. chewable    Historical Provider, MD  Hazel Sleep aide    Historical Provider, MD  POTASSIUM GLUCONATE PO Take 550 mg by mouth daily.    Historical Provider, MD  Probiotic Product (PROBIOTIC DAILY PO) Take by mouth daily.    Historical Provider, MD  Zinc 30 MG TABS Take 30 mg by mouth daily.    Historical Provider, MD   Social History   Social History  . Marital  status: Married    Spouse name: N/A  . Number of children: N/A  . Years of education: N/A   Occupational History  . CPA    Social History Main Topics  . Smoking status: Never Smoker  . Smokeless tobacco: Never Used  . Alcohol use Yes     Comment: 1 quart of day-5 glasses wine a day   . Drug use: No  . Sexual activity: Yes     Comment: number of sex partners in the last 85 months  1 Joycelyn Schmid)   Other Topics Concern  . Not on file   Social History Narrative   Exercise walking 2 times a week for 1 mile   Married   Charity fundraiser   Review of Systems  Allergic/Immunologic: Positive for environmental allergies.  Psychiatric/Behavioral: Positive for agitation.  13 point ROS positive for the above as well as concerns for skin CA Objective:  Physical Exam  Constitutional: He is oriented to person, place, and time. He appears well-developed and well-nourished.  HENT:  Head: Normocephalic and atraumatic.  Right Ear: External ear normal.  Left Ear: External ear normal.  Mouth/Throat: Oropharynx is clear and moist.  Eyes: Conjunctivae and EOM are normal. Pupils are equal, round, and reactive to light.  Neck: Normal range of motion. Neck supple. No thyromegaly present.  Cardiovascular: Normal rate, regular rhythm, normal heart sounds and intact distal pulses.   Pulmonary/Chest: Effort normal and breath sounds normal. No respiratory distress. He has no wheezes.  Abdominal: Soft. He exhibits no distension. There is no tenderness. Hernia confirmed negative in the right inguinal area and confirmed negative in the left inguinal area.  No apparent HSM  Genitourinary: Prostate normal.  Musculoskeletal: Normal range of motion. He exhibits no edema or tenderness.  Lymphadenopathy:    He has no cervical adenopathy.  Neurological: He is alert and oriented to person, place, and time. He has normal reflexes.  Skin: Skin is warm and dry.  Multiple dark nevi on his back: some greater than 5 mm, some  with light and dark spots Lateral nose: There was approx 4-72mm elevated slightly hyperpigmented lesions  Psychiatric: He has a normal mood and affect. His behavior is normal.  Vitals reviewed.  BP 124/82   Pulse 100   Temp 97.6 F (36.4 C) (Oral)   Resp 16   Ht 5\' 11"  (1.803 m)   Wt 152 lb 9.6 oz (69.2 kg)   SpO2 100%   BMI 21.28 kg/m  Assessment & Plan:   MAE BEAUCHAMP is a 70 y.o. male Medicare annual wellness visit, subsequent  - -anticipatory guidance as below in AVS, screening labs above. Health maintenance items as above in HPI discussed/recommended as applicable.   Flu vaccine need - Plan: Flu Vaccine QUAD 36+ mos IM  Actinic keratosis - Plan: Ambulatory referral to Dermatology [place] - Plan: Ambulatory referral to Dermatology  - few abnormal nevi - refer to derm for eval and likely biopsy.   History of duodenal ulcer - Plan: COMPLETE METABOLIC PANEL WITH GFR, CBC with Differential/Platelet Peptic ulcer disease - Plan: COMPLETE METABOLIC PANEL WITH GFR, CBC with Differential/Platelet  - cutting back on alcohol discussed, but may need to be on PPI QD as prior recommended at time of ulcer. Advised to call GI to discuss.   Screening for prostate cancer - Plan: PSA  - We discussed pros and cons of prostate cancer screening, and after this discussion, he chose to have screening done. PSA obtained, and no concerning findings on DRE.   Screening for hyperlipidemia - Plan: Lipid panel  Alcohol abuse - Plan: COMPLETE METABOLIC PANEL WITH GFR  - cutting back to no more than 1-2 glasses per day, esp with hx of ulcer. Advised to let me know if resources desired. Discussed counselor, but declined at this time.   Need for hepatitis C screening test - Plan: Hepatitis C antibody   No orders of the defined types were placed in this encounter.  Patient Instructions    Call your gastroenterologist to determine if you should be on a medicine like prilosec every day with your  history of ulcer.  Let me know when you are ready to meet with someone on cutting back on alcohol, or if you have other questions.   Sign up for mychart for easiest access to labwork.   Keeping you healthy  Get these tests  Blood pressure- Have your blood pressure checked once a  year by your healthcare provider.  Normal blood pressure is 120/80  Weight- Have your body mass index (BMI) calculated to screen for obesity.  BMI is a measure of body fat based on height and weight. You can also calculate your own BMI at [email or web address].  Cholesterol- Have your cholesterol checked every year.  Diabetes- Have your blood sugar checked regularly if you have high blood pressure, high cholesterol, have a family history of diabetes or if you are overweight.  Screening for Colon Cancer- Colonoscopy starting at age 71.  Screening may begin sooner depending on your family history and other health conditions. Follow up colonoscopy as directed by your Gastroenterologist.  Screening for Prostate Cancer- Both blood work (PSA) and a rectal exam help screen for Prostate Cancer.  Screening begins at age 70 with African-American men and at age 78 with Caucasian men.  Screening may begin sooner depending on your family history.  Take these medicines  Aspirin- One aspirin daily can help prevent Heart disease and Stroke.  Flu shot- Every fall.  Tetanus- Every 10 years.  Zostavax- Once after the age of 62 to prevent Shingles.  Pneumonia shot- Once after the age of 37; if you are younger than 3, ask your healthcare provider if you need a Pneumonia shot.  Take these steps  Don't smoke- If you do smoke, talk to your doctor about quitting.  For tips on how to quit, go to www.smokefree.gov or call 1-800-QUIT-NOW.  Be physically active- Exercise 5 days a week for at least 30 minutes.  If you are not already physically active start slow and gradually work up to 30 minutes of moderate physical activity.   Examples of moderate activity include walking briskly, mowing the yard, dancing, swimming, bicycling, etc.  Eat a healthy diet- Eat a variety of healthy food such as fruits, vegetables, low fat milk, low fat cheese, yogurt, lean meant, poultry, fish, beans, tofu, etc. For more information go to www.thenutritionsource.org  Drink alcohol in moderation- Limit alcohol intake to less than two drinks a day. Never drink and drive.  Dentist- Brush and floss twice daily; visit your dentist twice a year.  Depression- Your emotional health is as important as your physical health. If you're feeling down, or losing interest in things you would normally enjoy please talk to your healthcare provider.  Eye exam- Visit your eye doctor every year.  Safe sex- If you may be exposed to a sexually transmitted infection, use a condom.  Seat belts- Seat belts can save your life; always wear one.  Smoke/Carbon Monoxide detectors- These detectors need to be installed on the appropriate level of your home.  Replace batteries at least once a year.  Skin cancer- When out in the sun, cover up and use sunscreen 15 SPF or higher.  Violence- If anyone is threatening you, please tell your healthcare provider.  Living Will/ Health care power of attorney- Speak with your healthcare provider and family.  IF you received an x-Hendricks today, you will receive an invoice from [facility]. Please contact [facility] at [phone] with questions or concerns regarding your invoice.   IF you received labwork today, you will receive an invoice from [employer]. Please contact [facility] at [phone] with questions or concerns regarding your invoice.   Our billing staff will not be able to assist you with questions regarding bills from these companies.  You will be contacted with the lab results as soon as they are available. The fastest way to get  your results is to activate your My Chart  account. Instructions are located on the last page of this paperwork. If you have not heard from [place] regarding the results in 2 weeks, please contact this office.        I personally performed the services described in this documentation, which was scribed in my presence. The recorded information has been reviewed and considered, and addended by me as needed.   Signed,   Christopher Ray, MD [facility].  03/16/16 10:21 PM

## 2016-03-13 NOTE — Patient Instructions (Addendum)
Call your gastroenterologist to determine if you should be on a medicine like prilosec every day with your history of ulcer.  Let me know when you are ready to meet with someone on cutting back on alcohol, or if you have other questions.   Sign up for mychart for easiest access to labwork.   Keeping you healthy  Get these tests  Blood pressure- Have your blood pressure checked once a year by your healthcare provider.  Normal blood pressure is 120/80  Weight- Have your body mass index (BMI) calculated to screen for obesity.  BMI is a measure of body fat based on height and weight. You can also calculate your own BMI at [email or web address].  Cholesterol- Have your cholesterol checked every year.  Diabetes- Have your blood sugar checked regularly if you have high blood pressure, high cholesterol, have a family history of diabetes or if you are overweight.  Screening for Colon Cancer- Colonoscopy starting at age 40.  Screening may begin sooner depending on your family history and other health conditions. Follow up colonoscopy as directed by your Gastroenterologist.  Screening for Prostate Cancer- Both blood work (PSA) and a rectal exam help screen for Prostate Cancer.  Screening begins at age 7 with African-American men and at age 43 with Caucasian men.  Screening may begin sooner depending on your family history.  Take these medicines  Aspirin- One aspirin daily can help prevent Heart disease and Stroke.  Flu shot- Every fall.  Tetanus- Every 10 years.  Zostavax- Once after the age of 41 to prevent Shingles.  Pneumonia shot- Once after the age of 34; if you are younger than 37, ask your healthcare provider if you need a Pneumonia shot.  Take these steps  Don't smoke- If you do smoke, talk to your doctor about quitting.  For tips on how to quit, go to www.smokefree.gov or call 1-800-QUIT-NOW.  Be physically active- Exercise 5 days a week for at least 30 minutes.  If you are  not already physically active start slow and gradually work up to 30 minutes of moderate physical activity.  Examples of moderate activity include walking briskly, mowing the yard, dancing, swimming, bicycling, etc.  Eat a healthy diet- Eat a variety of healthy food such as fruits, vegetables, low fat milk, low fat cheese, yogurt, lean meant, poultry, fish, beans, tofu, etc. For more information go to www.thenutritionsource.org  Drink alcohol in moderation- Limit alcohol intake to less than two drinks a day. Never drink and drive.  Dentist- Brush and floss twice daily; visit your dentist twice a year.  Depression- Your emotional health is as important as your physical health. If you're feeling down, or losing interest in things you would normally enjoy please talk to your healthcare provider.  Eye exam- Visit your eye doctor every year.  Safe sex- If you may be exposed to a sexually transmitted infection, use a condom.  Seat belts- Seat belts can save your life; always wear one.  Smoke/Carbon Monoxide detectors- These detectors need to be installed on the appropriate level of your home.  Replace batteries at least once a year.  Skin cancer- When out in the sun, cover up and use sunscreen 15 SPF or higher.  Violence- If anyone is threatening you, please tell your healthcare provider.  Living Will/ Health care power of attorney- Speak with your healthcare provider and family.  IF you received an x-ray today, you will receive an invoice from [facility]. Please contact [facility] at [phone] with  questions or concerns regarding your invoice.   IF you received labwork today, you will receive an invoice from [employer]. Please contact [facility] at [phone] with questions or concerns regarding your invoice.   Our billing staff will not be able to assist you with questions regarding bills from these companies.  You will be contacted with the  lab results as soon as they are available. The fastest way to get your results is to activate your My Chart account. Instructions are located on the last page of this paperwork. If you have not heard from [place] regarding the results in 2 weeks, please contact this office.

## 2016-03-14 LAB — PSA: PSA: 1.1 ng/mL (ref <=4.0)

## 2016-03-17 ENCOUNTER — Telehealth: Payer: Self-pay | Admitting: Gastroenterology

## 2016-03-17 NOTE — Telephone Encounter (Signed)
Dr. Rolly Salter note instructed the patient to contact GI to inquire if he needs long term PPI due to his history of ulcer.  Dr. Fuller Plan please review and advise

## 2016-03-17 NOTE — Telephone Encounter (Signed)
See my office note from 2014. Recommended daily PPI indefinitely given history of bleeding DU.

## 2016-03-17 NOTE — Telephone Encounter (Signed)
Patient notified of the recommendations.  

## 2016-05-05 DIAGNOSIS — L821 Other seborrheic keratosis: Secondary | ICD-10-CM | POA: Diagnosis not present

## 2016-05-05 DIAGNOSIS — D225 Melanocytic nevi of trunk: Secondary | ICD-10-CM | POA: Diagnosis not present

## 2016-05-05 DIAGNOSIS — L814 Other melanin hyperpigmentation: Secondary | ICD-10-CM | POA: Diagnosis not present

## 2016-05-05 DIAGNOSIS — L570 Actinic keratosis: Secondary | ICD-10-CM | POA: Diagnosis not present

## 2017-03-17 ENCOUNTER — Encounter: Payer: PPO | Admitting: Family Medicine

## 2017-03-18 ENCOUNTER — Ambulatory Visit (INDEPENDENT_AMBULATORY_CARE_PROVIDER_SITE_OTHER): Payer: PPO

## 2017-03-18 VITALS — BP 130/70 | HR 81 | Temp 98.1°F | Ht 71.0 in | Wt 156.0 lb

## 2017-03-18 DIAGNOSIS — Z0000 Encounter for general adult medical examination without abnormal findings: Secondary | ICD-10-CM

## 2017-03-18 DIAGNOSIS — Z23 Encounter for immunization: Secondary | ICD-10-CM | POA: Diagnosis not present

## 2017-03-18 DIAGNOSIS — Z131 Encounter for screening for diabetes mellitus: Secondary | ICD-10-CM

## 2017-03-18 DIAGNOSIS — Z125 Encounter for screening for malignant neoplasm of prostate: Secondary | ICD-10-CM | POA: Diagnosis not present

## 2017-03-18 DIAGNOSIS — Z13220 Encounter for screening for lipoid disorders: Secondary | ICD-10-CM

## 2017-03-18 NOTE — Progress Notes (Signed)
Subjective:   Christopher Hendricks is a 71 y.o. male who presents for Medicare Annual/Subsequent preventive examination.  Review of Systems:  N/A Cardiac Risk Factors include: advanced age (>56men, >92 women);male gender;Other (see comment), Risk factor comments: Alcohol use     Objective:    Vitals: BP 130/70   Pulse 81   Temp 98.1 F (36.7 C) (Oral)   Ht 5\' 11"  (1.803 m)   Wt 156 lb (70.8 kg)   SpO2 100%   BMI 21.76 kg/m   Body mass index is 21.76 kg/m.  Advanced Directives 03/18/2017 03/03/2016 02/18/2016 03/10/2014 08/17/2012  Does Patient Have a Medical Advance Directive? Yes Yes Yes Yes Patient has advance directive, copy in chart  Type of Advance Directive [place];Living will [place];Living will Living will;Healthcare Power of [place];Living will [place];Living will  Copy of [place] in Chart? Yes - - No - copy requested -  Pre-existing out of facility DNR order (yellow form or pink MOST form) - - - - No    Tobacco Social History   Tobacco Use  Smoking Status Never Smoker  Smokeless Tobacco Never Used     Counseling given: Not Answered   Clinical Intake:  Pre-visit preparation completed: Yes  Pain : No/denies pain     Nutritional Status: BMI of 19-24  Normal Nutritional Risks: None Diabetes: No  Activities of Daily Living: Independent Ambulation: Independent with device- listed below Home Assistive Devices/Equipment: Eyeglasses Medication Administration: Independent Home Management: Independent  Barriers to Care Management & Learning: None  Do you feel unsafe in your current relationship?: No Do you feel physically threatened by others?: No Anyone hurting you at home, work, or school?: No Unable to ask?: No  How often do you need to have someone help you when you read instructions, pamphlets, or other written materials from your doctor or  pharmacy?: 1 - Never What is the last grade level you completed in school?: MBA  Interpreter Needed?: No  Information entered by :: Andrez Grime, LPN  Past Medical History:  Diagnosis Date  . Alcoholism /alcohol abuse ([place])   . Allergy   . Arthritis   . Blood transfusion without reported diagnosis   . Duodenal ulcer   . Heart murmur    as teen  . Neuromuscular disorder ([place])    pinched nerve in neck that causes numbness in fingers on right hand   . Sigmoid diverticulosis 2007   Past Surgical History:  Procedure Laterality Date  . COLONOSCOPY  2007   sigmoid diverticulosis.  Dr Fuller Plan  . ESOPHAGOGASTRODUODENOSCOPY N/A 08/18/2012   Procedure: ESOPHAGOGASTRODUODENOSCOPY (EGD);  Surgeon: Irene Shipper, MD;  Location: [facility] ENDOSCOPY;  Service: Endoscopy;  Laterality: N/A;  . HERNIA REPAIR    . SMALL INTESTINE SURGERY    . UPPER GASTROINTESTINAL ENDOSCOPY    . VASECTOMY     Family History  Adopted: Yes  Problem Relation Age of Onset  . Diabetes Daughter    Social History   Socioeconomic History  . Marital status: Married    Spouse name: None  . Number of children: 1  . Years of education: None  . Highest education level: Master's degree (e.g., MA, MS, MEng, MEd, MSW, MBA)  Social Needs  . Financial resource strain: Not hard at all  . Food insecurity - worry: Never true  . Food insecurity - inability: Never true  . Transportation needs - medical: No  .  Transportation needs - non-medical: No  Occupational History  . Occupation: Engineer, maintenance (IT)  Tobacco Use  . Smoking status: Never Smoker  . Smokeless tobacco: Never Used  Substance and Sexual Activity  . Alcohol use: Yes    Comment: 1 quart of day-5 glasses wine a day   . Drug use: No  . Sexual activity: Yes    Comment: number of sex partners in the last 23 months  1 Joycelyn Schmid)  Other Topics Concern  . None  Social History Narrative   Exercise walking 2 times a week for 1 mile   Married   CPA   MBA    Outpatient  Encounter Medications as of 03/18/2017  Medication Sig  . Acetaminophen (TYLENOL PO) Take by mouth at bedtime.   . Biotin (BIOTIN 5000) 5 MG CAPS Take 5,000 mg by mouth every other day.   . DiphenhydrAMINE HCl (BENADRYL PO) Take 25 mg by mouth at bedtime.   Marland Kitchen glucosamine-chondroitin 500-400 MG tablet Take 1 tablet by mouth daily.  . Magnesium 500 MG CAPS Take by mouth every other day.   . Melatonin 5 MG TABS Take by mouth at bedtime.  . Multiple Vitamins-Minerals (MULTIVITAMIN PO) Take by mouth daily. chewable   . omeprazole (PRILOSEC) 20 MG capsule Take 20 mg by mouth daily.  Marland Kitchen OVER THE COUNTER MEDICATION Sleep aide  . POTASSIUM GLUCONATE PO Take 550 mg by mouth every other day.   . Probiotic Product (PROBIOTIC DAILY PO) Take by mouth every other day.   . Zinc 30 MG TABS Take 50 mg by mouth daily.    Facility-Administered Encounter Medications as of 03/18/2017  Medication  . 0.9 %  sodium chloride infusion    Activities of Daily Living In your present state of health, do you have any difficulty performing the following activities: 03/18/2017  Hearing? N  Vision? Y  Comment small print on forms   Difficulty concentrating or making decisions? N  Walking or climbing stairs? N  Dressing or bathing? N  Doing errands, shopping? N  Preparing Food and eating ? N  Using the Toilet? N  In the past six months, have you accidently leaked urine? N  Do you have problems with loss of bowel control? N  Managing your Medications? N  Managing your Finances? N  Housekeeping or managing your Housekeeping? N  Some recent data might be hidden    Timed Get Up and Go Performed: yes, passed, completed within 30 seconds   Patient Care Team: [name], MD as PCP - General (Family Medicine) Ladene Artist, MD as Consulting Physician (Gastroenterology) Harriett Sine, MD as Consulting Physician (Dermatology)   Assessment:     Exercise Activities and Dietary recommendations Current  Exercise Habits: Home exercise routine, Type of exercise: walking, Time (Minutes): 30, Frequency (Times/Week): 5, Weekly Exercise (Minutes/Week): 150, Intensity: Mild, Exercise limited by: None identified  Goals    . Patient Stated     Patient states that he wants to continue his current health status. He doesn't want to change anything right now.       Fall Risk Fall Risk  03/18/2017 03/13/2016 03/10/2014  Falls in the past year? No No No   Is the patient's home free of loose throw rugs in walkways, pet beds, electrical cords, etc?   yes      Grab bars in the bathroom? no      Handrails on the stairs?   yes      Adequate lighting?  yes Depression Screen PHQ 2/9 Scores 03/18/2017 03/13/2016 03/01/2015 03/10/2014  PHQ - 2 Score 1 0 0 0    Cognitive Function     6CIT Screen 03/18/2017  What Year? 0 points  What month? 0 points  What time? 0 points  Count back from 20 0 points  Months in reverse 0 points  Repeat phrase 2 points  Total Score 2    Immunization History  Administered Date(s) Administered  . Influenza Split 02/24/2012  . Influenza,inj,Quad PF,6+ Mos 03/01/2013, 03/10/2014, 03/01/2015, 03/13/2016, 03/18/2017  . Pneumococcal Conjugate-13 03/01/2015  . Pneumococcal Polysaccharide-23 08/18/2012  . Zoster 09/18/2012   Screening Tests Health Maintenance  Topic Date Due  . TETANUS/TDAP  12/07/2019  . COLONOSCOPY  03/03/2021  . INFLUENZA VACCINE  Completed  . Hepatitis C Screening  Completed  . PNA vac Low Risk Adult  Completed   Cancer Screenings: Lung:  Low Dose CT Chest recommended if Age 75-80 years, 30 pack-year currently smoking OR have quit w/in 15years. Patient does not qualify.  Up to date of Bone Density/Dexa? Not indicated Colorectal: completed colonoscopy on 03/03/2016  Additional Screenings:  Hepatitis B/HIV/Syphillis:not indicated Hepatitis C Screening: completed 03/13/2016    Plan:   I have personally reviewed and noted the following in the  patient's chart:   . Medical and social history . Use of alcohol, tobacco or illicit drugs  . Current medications and supplements . Functional ability and status . Nutritional status . Physical activity . Advanced directives . List of other physicians . Hospitalizations, surgeries, and ER visits in previous 12 months . Vitals . Screenings to include cognitive, depression, and falls . Referrals and appointments  In addition, I have reviewed and discussed with patient certain preventive protocols, quality metrics, and best practice recommendations. A written personalized care plan for preventive services as well as general preventive health recommendations were provided to patient.   1. Screening cholesterol level - Lipid panel - CBC with Differential/Platelet  2. Screening for diabetes mellitus - Comprehensive metabolic panel - Urinalysis, Complete  3. Special screening for malignant neoplasm of prostate - PSA  4. Encounter for Medicare annual wellness exam  5. Need for immunization against influenza - Flu Vaccine QUAD 6+ mos IM (Fluarix)   Andrez Grime, LPN  26/12/4852

## 2017-03-18 NOTE — Patient Instructions (Addendum)
Christopher Hendricks , Thank you for taking time to come for your Medicare Wellness Visit. I appreciate your ongoing commitment to your health goals. Please review the following plan we discussed and let me know if I can assist you in the future.   Screening recommendations/referrals: Colonoscopy: up to date, next due 03/03/2021 Recommended yearly ophthalmology/optometry visit for glaucoma screening and checkup Recommended yearly dental visit for hygiene and checkup  Vaccinations: Influenza vaccine: administered today  Pneumococcal vaccine: up to date Tdap vaccine: declined due to insurance Shingles vaccine: up to date, Check with your pharmacy about receiving the new Shingrix vaccine    Advanced directives: copy in chart  Conditions/risks identified: You want to continue your current health status.  Next appointment: 03/23/17 @ 8:20 am with Dr. Carlota Raspberry   Preventive Care 65 Years and Older, Male Preventive care refers to lifestyle choices and visits with your health care provider that can promote health and wellness. What does preventive care include?  A yearly physical exam. This is also called an annual well check.  Dental exams once or twice a year.  Routine eye exams. Ask your health care provider how often you should have your eyes checked.  Personal lifestyle choices, including:  Daily care of your teeth and gums.  Regular physical activity.  Eating a healthy diet.  Avoiding tobacco and drug use.  Limiting alcohol use.  Practicing safe sex.  Taking low doses of aspirin every day.  Taking vitamin and mineral supplements as recommended by your health care provider. What happens during an annual well check? The services and screenings done by your health care provider during your annual well check will depend on your age, overall health, lifestyle risk factors, and family history of disease. Counseling  Your health care provider may ask you questions about your:  Alcohol  use.  Tobacco use.  Drug use.  Emotional well-being.  Home and relationship well-being.  Sexual activity.  Eating habits.  History of falls.  Memory and ability to understand (cognition).  Work and work Statistician. Screening  You may have the following tests or measurements:  Height, weight, and BMI.  Blood pressure.  Lipid and cholesterol levels. These may be checked every 5 years, or more frequently if you are over 50 years old.  Skin check.  Lung cancer screening. You may have this screening every year starting at age 15 if you have a 30-pack-year history of smoking and currently smoke or have quit within the past 15 years.  Fecal occult blood test (FOBT) of the stool. You may have this test every year starting at age 20.  Flexible sigmoidoscopy or colonoscopy. You may have a sigmoidoscopy every 5 years or a colonoscopy every 10 years starting at age 67.  Prostate cancer screening. Recommendations will vary depending on your family history and other risks.  Hepatitis C blood test.  Hepatitis B blood test.  Sexually transmitted disease (STD) testing.  Diabetes screening. This is done by checking your blood sugar (glucose) after you have not eaten for a while (fasting). You may have this done every 1-3 years.  Abdominal aortic aneurysm (AAA) screening. You may need this if you are a current or former smoker.  Osteoporosis. You may be screened starting at age 57 if you are at high risk. Talk with your health care provider about your test results, treatment options, and if necessary, the need for more tests. Vaccines  Your health care provider may recommend certain vaccines, such as:  Influenza vaccine. This  is recommended every year.  Tetanus, diphtheria, and acellular pertussis (Tdap, Td) vaccine. You may need a Td booster every 10 years.  Zoster vaccine. You may need this after age 43.  Pneumococcal 13-valent conjugate (PCV13) vaccine. One dose is  recommended after age 69.  Pneumococcal polysaccharide (PPSV23) vaccine. One dose is recommended after age 58. Talk to your health care provider about which screenings and vaccines you need and how often you need them. This information is not intended to replace advice given to you by your health care provider. Make sure you discuss any questions you have with your health care provider. Document Released: 04/27/2015 Document Revised: 12/19/2015 Document Reviewed: 01/30/2015 Elsevier Interactive Patient Education  2017 [place] Prevention in the Home Falls can cause injuries. They can happen to people of all ages. There are many things you can do to make your home safe and to help prevent falls. What can I do on the outside of my home?  Regularly fix the edges of walkways and driveways and fix any cracks.  Remove anything that might make you trip as you walk through a door, such as a raised step or threshold.  Trim any bushes or trees on the path to your home.  Use bright outdoor lighting.  Clear any walking paths of anything that might make someone trip, such as rocks or tools.  Regularly check to see if handrails are loose or broken. Make sure that both sides of any steps have handrails.  Any raised decks and porches should have guardrails on the edges.  Have any leaves, snow, or ice cleared regularly.  Use sand or salt on walking paths during winter.  Clean up any spills in your garage right away. This includes oil or grease spills. What can I do in the bathroom?  Use night lights.  Install grab bars by the toilet and in the tub and shower. Do not use towel bars as grab bars.  Use non-skid mats or decals in the tub or shower.  If you need to sit down in the shower, use a plastic, non-slip stool.  Keep the floor dry. Clean up any water that spills on the floor as soon as it happens.  Remove soap buildup in the tub or shower regularly.  Attach bath mats  securely with double-sided non-slip rug tape.  Do not have throw rugs and other things on the floor that can make you trip. What can I do in the bedroom?  Use night lights.  Make sure that you have a light by your bed that is easy to reach.  Do not use any sheets or blankets that are too big for your bed. They should not hang down onto the floor.  Have a firm chair that has side arms. You can use this for support while you get dressed.  Do not have throw rugs and other things on the floor that can make you trip. What can I do in the kitchen?  Clean up any spills right away.  Avoid walking on wet floors.  Keep items that you use a lot in easy-to-reach places.  If you need to reach something above you, use a strong step stool that has a grab bar.  Keep electrical cords out of the way.  Do not use floor polish or wax that makes floors slippery. If you must use wax, use non-skid floor wax.  Do not have throw rugs and other things on the floor that can make you  trip. What can I do with my stairs?  Do not leave any items on the stairs.  Make sure that there are handrails on both sides of the stairs and use them. Fix handrails that are broken or loose. Make sure that handrails are as long as the stairways.  Check any carpeting to make sure that it is firmly attached to the stairs. Fix any carpet that is loose or worn.  Avoid having throw rugs at the top or bottom of the stairs. If you do have throw rugs, attach them to the floor with carpet tape.  Make sure that you have a light switch at the top of the stairs and the bottom of the stairs. If you do not have them, ask someone to add them for you. What else can I do to help prevent falls?  Wear shoes that:  Do not have high heels.  Have rubber bottoms.  Are comfortable and fit you well.  Are closed at the toe. Do not wear sandals.  If you use a stepladder:  Make sure that it is fully opened. Do not climb a closed  stepladder.  Make sure that both sides of the stepladder are locked into place.  Ask someone to hold it for you, if possible.  Clearly mark and make sure that you can see:  Any grab bars or handrails.  First and last steps.  Where the edge of each step is.  Use tools that help you move around (mobility aids) if they are needed. These include:  Canes.  Walkers.  Scooters.  Crutches.  Turn on the lights when you go into a dark area. Replace any light bulbs as soon as they burn out.  Set up your furniture so you have a clear path. Avoid moving your furniture around.  If any of your floors are uneven, fix them.  If there are any pets around you, be aware of where they are.  Review your medicines with your doctor. Some medicines can make you feel dizzy. This can increase your chance of falling. Ask your doctor what other things that you can do to help prevent falls. This information is not intended to replace advice given to you by your health care provider. Make sure you discuss any questions you have with your health care provider. Document Released: 01/25/2009 Document Revised: 09/06/2015 Document Reviewed: 05/05/2014 Elsevier Interactive Patient Education  2017 [employer].

## 2017-03-19 LAB — COMPREHENSIVE METABOLIC PANEL
A/G RATIO: 1.9 (ref 1.2–2.2)
ALBUMIN: 4.8 g/dL (ref 3.5–4.8)
ALT: 37 IU/L (ref 0–44)
AST: 29 IU/L (ref 0–40)
Alkaline Phosphatase: 92 IU/L (ref 39–117)
BUN / CREAT RATIO: 14 (ref 10–24)
BUN: 12 mg/dL (ref 8–27)
Bilirubin Total: 1.2 mg/dL (ref 0.0–1.2)
CALCIUM: 9.8 mg/dL (ref 8.6–10.2)
CO2: 28 mmol/L (ref 20–29)
Chloride: 100 mmol/L (ref 96–106)
Creatinine, Ser: 0.83 mg/dL (ref 0.76–1.27)
GFR, EST AFRICAN AMERICAN: 103 mL/min/{1.73_m2} (ref >59)
GFR, EST NON AFRICAN AMERICAN: 89 mL/min/{1.73_m2} (ref >59)
Globulin, Total: 2.5 g/dL (ref 1.5–4.5)
Glucose: 81 mg/dL (ref 65–99)
POTASSIUM: 4.4 mmol/L (ref 3.5–5.2)
Sodium: 142 mmol/L (ref 134–144)
TOTAL PROTEIN: 7.3 g/dL (ref 6.0–8.5)

## 2017-03-19 LAB — CBC WITH DIFFERENTIAL/PLATELET
BASOS: 1 %
Basophils Absolute: 0 10*3/uL (ref 0.0–0.2)
EOS (ABSOLUTE): 0.1 10*3/uL (ref 0.0–0.4)
Eos: 1 %
HEMOGLOBIN: 14.9 g/dL (ref 13.0–17.7)
Hematocrit: 44.2 % (ref 37.5–51.0)
IMMATURE GRANS (ABS): 0 10*3/uL (ref 0.0–0.1)
Immature Granulocytes: 0 %
LYMPHS ABS: 1.1 10*3/uL (ref 0.7–3.1)
LYMPHS: 26 %
MCH: 32.1 pg (ref 26.6–33.0)
MCHC: 33.7 g/dL (ref 31.5–35.7)
MCV: 95 fL (ref 79–97)
MONOCYTES: 15 %
Monocytes Absolute: 0.6 10*3/uL (ref 0.1–0.9)
NEUTROS ABS: 2.4 10*3/uL (ref 1.4–7.0)
Neutrophils: 57 %
Platelets: 215 10*3/uL (ref 150–379)
RBC: 4.64 x10E6/uL (ref 4.14–5.80)
RDW: 13.3 % (ref 12.3–15.4)
WBC: 4.2 10*3/uL (ref 3.4–10.8)

## 2017-03-19 LAB — URINALYSIS, COMPLETE
Bilirubin, UA: NEGATIVE
Glucose, UA: NEGATIVE
Ketones, UA: NEGATIVE
Leukocytes, UA: NEGATIVE
NITRITE UA: NEGATIVE
PH UA: 7.5 (ref 5.0–7.5)
PROTEIN UA: NEGATIVE
RBC UA: NEGATIVE
Specific Gravity, UA: 1.02 (ref 1.005–1.030)
UUROB: 0.2 mg/dL (ref 0.2–1.0)

## 2017-03-19 LAB — LIPID PANEL
Chol/HDL Ratio: 3.4 ratio (ref 0.0–5.0)
Cholesterol, Total: 209 mg/dL — ABNORMAL HIGH (ref 100–199)
HDL: 61 mg/dL
LDL Calculated: 131 mg/dL — ABNORMAL HIGH (ref 0–99)
Triglycerides: 86 mg/dL (ref 0–149)
VLDL Cholesterol Cal: 17 mg/dL (ref 5–40)

## 2017-03-19 LAB — MICROSCOPIC EXAMINATION
Bacteria, UA: NONE SEEN
CASTS: NONE SEEN /LPF
EPITHELIAL CELLS (NON RENAL): NONE SEEN /HPF (ref 0–10)

## 2017-03-19 LAB — PSA: Prostate Specific Ag, Serum: 1.4 ng/mL (ref 0.0–4.0)

## 2017-03-23 ENCOUNTER — Encounter: Payer: PPO | Admitting: Family Medicine

## 2017-03-23 ENCOUNTER — Encounter: Payer: Self-pay | Admitting: Family Medicine

## 2017-03-30 ENCOUNTER — Other Ambulatory Visit: Payer: Self-pay

## 2017-03-30 ENCOUNTER — Ambulatory Visit (INDEPENDENT_AMBULATORY_CARE_PROVIDER_SITE_OTHER): Payer: PPO | Admitting: Family Medicine

## 2017-03-30 ENCOUNTER — Encounter: Payer: Self-pay | Admitting: Family Medicine

## 2017-03-30 VITALS — BP 128/80 | HR 72 | Temp 98.5°F | Resp 18 | Ht 71.0 in | Wt 156.6 lb

## 2017-03-30 DIAGNOSIS — E785 Hyperlipidemia, unspecified: Secondary | ICD-10-CM | POA: Diagnosis not present

## 2017-03-30 DIAGNOSIS — Z789 Other specified health status: Secondary | ICD-10-CM | POA: Diagnosis not present

## 2017-03-30 DIAGNOSIS — Z8719 Personal history of other diseases of the digestive system: Secondary | ICD-10-CM

## 2017-03-30 DIAGNOSIS — Z7289 Other problems related to lifestyle: Secondary | ICD-10-CM

## 2017-03-30 NOTE — Progress Notes (Signed)
Subjective:  By signing my name below, I, [name], attest that this documentation has been prepared under the direction and in the presence of [name], MD Electronically Signed: Ladene Artist, ED Scribe 03/30/2017 at 4:28 PM.   Patient ID: Christopher Hendricks, male    DOB: [birth year], 71 y.o.   MRN: [id number]  Chief Complaint  Patient presents with   Follow-up    from [facility]    HPI [name] is a 71 y.o. male who presents to Primary Care at [facility] for f/u. Last seen for a medicare wellness exam 12/5.   H/o Duodenal Ulcer Takes omeprazole. Recommended to continue daily PPI by Dr. Fuller Plan.  H/o Alcohol Use When discussed in 2017 he was drinking 5-6 glasses of wine/night. Recommended to cut back on alcohol at that visit. Today, pt reports that he has cut back to 4-5 glasses of wine/night.  Hyperlipidemia Lab Results  Component Value Date   CHOL 209 (H) 03/18/2017   HDL 61 03/18/2017   LDLCALC 131 (H) 03/18/2017   TRIG 86 03/18/2017   CHOLHDL 3.4 03/18/2017   Lab Results  Component Value Date   ALT 37 03/18/2017   AST 29 03/18/2017   ALKPHOS 92 03/18/2017   BILITOT 1.2 03/18/2017  Recommended monitoring diet and exercise with repeat in 6 months. Pt states he has been trying to gain weight and overall has a healthy diet. Reports eating cheerios with blueberries and skim milk for breakfast, sandwiches for lunch and cooking with his [employer]. [place] and [place] work but has not gotten any exercise within the past week. Denies cp, chest tightness, sob, light-headedness, dizziness, blood in stools, melena.   The 10-year ASCVD risk score [name]., et al., 2013) is: 17%   Values used to calculate the score:     Age: 71 years     Sex: Male     Is Non-Hispanic African American: No     Diabetic: No     Tobacco smoker: No     Systolic Blood Pressure: 967 mmHg     Is BP treated: No     HDL Cholesterol: 61  mg/dL     Total Cholesterol: 209 mg/dL  Dermatology Dr. Elvera Lennox is following him for spots on his skin.  Patient Active Problem List   Diagnosis Date Noted   Duodenal ulcer with hemorrhage 08/18/2012   Acute posthemorrhagic anemia 08/18/2012   GI bleed 08/17/2012   Anemia 08/17/2012   Alcohol abuse 08/17/2012   Past Medical History:  Diagnosis Date   Alcoholism /alcohol abuse ([place])    Allergy    Arthritis    Blood transfusion without reported diagnosis    Duodenal ulcer    Heart murmur    as teen   Neuromuscular disorder ([place])    pinched nerve in neck that causes numbness in fingers on right hand    Sigmoid diverticulosis 2007   Past Surgical History:  Procedure Laterality Date   COLONOSCOPY  2007   sigmoid diverticulosis.  Dr Fuller Plan   ESOPHAGOGASTRODUODENOSCOPY N/A 08/18/2012   Procedure: ESOPHAGOGASTRODUODENOSCOPY (EGD);  Surgeon: Irene Shipper, MD;  Location: [facility] ENDOSCOPY;  Service: Endoscopy;  Laterality: N/A;   HERNIA REPAIR     SMALL INTESTINE SURGERY     UPPER GASTROINTESTINAL ENDOSCOPY     VASECTOMY     Allergies  Allergen Reactions   Codeine Nausea And Vomiting   Sulfa Antibiotics Rash  Prior to Admission medications   Medication Sig Start Date End Date Taking? Authorizing Provider  Acetaminophen (TYLENOL PO) Take by mouth at bedtime.     [provider]  Biotin (BIOTIN 5000) 5 MG CAPS Take 5,000 mg by mouth every other day.     [provider]  DiphenhydrAMINE HCl (BENADRYL PO) Take 25 mg by mouth at bedtime.     [provider]  glucosamine-chondroitin 500-400 MG tablet Take 1 tablet by mouth daily.    [provider]  Magnesium 500 MG CAPS Take by mouth every other day.     [provider]  Melatonin 5 MG TABS Take by mouth at bedtime.    [provider]  Multiple Vitamins-Minerals (MULTIVITAMIN PO) Take by mouth daily. chewable     [provider]  omeprazole (PRILOSEC)  20 MG capsule Take 20 mg by mouth daily.    [provider]  OVER THE COUNTER MEDICATION Sleep aide    [provider]  POTASSIUM GLUCONATE PO Take 550 mg by mouth every other day.     [provider]  Probiotic Product (PROBIOTIC DAILY PO) Take by mouth every other day.     [provider]  Zinc 30 MG TABS Take 50 mg by mouth daily.     [provider]   Social History   Socioeconomic History   Marital status: Married    Spouse name: Not on file   Number of children: 1   Years of education: Not on file   Highest education level: Master's degree (e.g., MA, MS, MEng, MEd, MSW, MBA)  Social Designer, fashion/clothing strain: Not hard at all   Food insecurity - worry: Never true   Food insecurity - inability: Never true   Transportation needs - medical: No   Transportation needs - non-medical: No  Occupational History   Occupation: Engineer, maintenance (IT)  Tobacco Use   Smoking status: Never Smoker   Smokeless tobacco: Never Used  Substance and Sexual Activity   Alcohol use: Yes    Comment: 1 quart of day-5 glasses wine a day    Drug use: No   Sexual activity: Yes    Comment: number of sex partners in the last 65 months  1 ([name])  Other Topics Concern   Not on file  Social History Narrative   Exercise walking 2 times a week for 1 mile   Married   Charity fundraiser   Review of Systems  Respiratory: Negative for chest tightness and shortness of breath.   Cardiovascular: Negative for chest pain.  Gastrointestinal: Negative for blood in stool.  Neurological: Negative for dizziness and light-headedness.      Objective:   Physical Exam  Constitutional: He is oriented to person, place, and time. He appears well-developed and well-nourished.  HENT:  Head: Normocephalic and atraumatic.  Eyes: EOM are normal. Pupils are equal, round, and reactive to light.  Neck: No JVD present. Carotid bruit is not present.  Cardiovascular: Normal rate,  regular rhythm and normal heart sounds.  No murmur heard. Pulmonary/Chest: Effort normal and breath sounds normal. He has no rales.  Musculoskeletal: He exhibits no edema.  Neurological: He is alert and oriented to person, place, and time.  Skin: Skin is warm.  Psychiatric: He has a normal mood and affect.  Vitals reviewed.  Vitals:   03/30/17 1543  BP: 128/80  Pulse: 72  Resp: 18  Temp: 98.5 F (36.9 C)  TempSrc: Oral  SpO2: 98%  Weight: 156 lb 9.6 oz (71 kg)  Height: 5\' 11"  (1.803 m)      Assessment & Plan:  MARQUEZ CEESAY is a 71 y.o. male Hyperlipidemia, unspecified hyperlipidemia type  - Minimally elevated LDL. ASCVD risk as above, but would have minimal change with statin based on age. With alcohol use, although the liver tests were normal, we'll hold off on statin at this time with repeat levels in 6 months.  History of duodenal ulcer  -Stable with PPI use, denies abdominal pain or recurrent symptoms  Alcohol use  -Recommended to continue cutting back. Potential risks to health were discussed in past as well as current visit, including with history of duodenal ulcer. Understanding expressed.  No orders of the defined types were placed in this encounter.  Patient Instructions    I would recommend walking for exercise 3-4 days per week, watch diet, and recheck cholesterol in 6 months to determine if medicine is recommended. It is only mildly elevated at this time. See handout for more info.   I would continue to decrease alcohol to no more than 2 drinks per day.   Continue omeprazole daily.       IF you received an x-ray today, you will receive an invoice from [facility] Rad[facility]. Please contact [facility] Radiology at [phone] with questions or concerns regarding your invoice.   IF you received labwork today, you will receive an invoice from [place]. Please contact [facility] at [phone] with questions or concerns regarding your invoice.   Our  billing staff will not be able to assist you with questions regarding bills from these companies.  You will be contacted with the lab results as soon as they are available. The fastest way to get your results is to activate your My Chart account. Instructions are located on the last page of this paperwork. If you have not heard from [place] regarding the results in 2 weeks, please contact this office.      I personally performed the services described in this documentation, which was scribed in my presence. The recorded information has been reviewed and considered for accuracy and completeness, addended by me as needed, and agree with information above.  Signed,   Merri Ray, MD Primary Care at [place].  03/30/17 6:48 PM

## 2017-03-30 NOTE — Patient Instructions (Addendum)
  I would recommend walking for exercise 3-4 days per week, watch diet, and recheck cholesterol in 6 months to determine if medicine is recommended. It is only mildly elevated at this time. See handout for more info.   I would continue to decrease alcohol to no more than 2 drinks per day.   Continue omeprazole daily.       IF you received an x-ray today, you will receive an invoice from [facility] Radiology. Please contact [facility] Radiology at [phone] with questions or concerns regarding your invoice.   IF you received labwork today, you will receive an invoice from [place]. Please contact [facility] at [phone] with questions or concerns regarding your invoice.   Our billing staff will not be able to assist you with questions regarding bills from these companies.  You will be contacted with the lab results as soon as they are available. The fastest way to get your results is to activate your My Chart account. Instructions are located on the last page of this paperwork. If you have not heard from [place] regarding the results in 2 weeks, please contact this office.

## 2017-09-22 ENCOUNTER — Encounter: Payer: Self-pay | Admitting: Family Medicine

## 2017-10-26 ENCOUNTER — Ambulatory Visit (INDEPENDENT_AMBULATORY_CARE_PROVIDER_SITE_OTHER): Payer: PPO | Admitting: Family Medicine

## 2017-10-26 ENCOUNTER — Encounter: Payer: Self-pay | Admitting: Family Medicine

## 2017-10-26 ENCOUNTER — Other Ambulatory Visit: Payer: Self-pay

## 2017-10-26 VITALS — BP 132/74 | HR 72 | Temp 98.5°F | Ht 71.5 in | Wt 154.4 lb

## 2017-10-26 DIAGNOSIS — E785 Hyperlipidemia, unspecified: Secondary | ICD-10-CM | POA: Diagnosis not present

## 2017-10-26 LAB — COMPREHENSIVE METABOLIC PANEL
A/G RATIO: 2 (ref 1.2–2.2)
ALK PHOS: 74 IU/L (ref 39–117)
ALT: 28 IU/L (ref 0–44)
AST: 22 IU/L (ref 0–40)
Albumin: 4.3 g/dL (ref 3.5–4.8)
BILIRUBIN TOTAL: 1.4 mg/dL — AB (ref 0.0–1.2)
BUN/Creatinine Ratio: 12 (ref 10–24)
BUN: 10 mg/dL (ref 8–27)
CALCIUM: 9.3 mg/dL (ref 8.6–10.2)
CHLORIDE: 103 mmol/L (ref 96–106)
CO2: 26 mmol/L (ref 20–29)
Creatinine, Ser: 0.84 mg/dL (ref 0.76–1.27)
GFR calc Af Amer: 103 mL/min/{1.73_m2} (ref >59)
GFR, EST NON AFRICAN AMERICAN: 89 mL/min/{1.73_m2} (ref >59)
GLOBULIN, TOTAL: 2.2 g/dL (ref 1.5–4.5)
Glucose: 79 mg/dL (ref 65–99)
Potassium: 4.1 mmol/L (ref 3.5–5.2)
SODIUM: 143 mmol/L (ref 134–144)
Total Protein: 6.5 g/dL (ref 6.0–8.5)

## 2017-10-26 LAB — LIPID PANEL
CHOLESTEROL TOTAL: 189 mg/dL (ref 100–199)
Chol/HDL Ratio: 3.3 ratio (ref 0.0–5.0)
HDL: 58 mg/dL (ref >39)
LDL CALC: 115 mg/dL — AB (ref 0–99)
TRIGLYCERIDES: 81 mg/dL (ref 0–149)
VLDL CHOLESTEROL CAL: 16 mg/dL (ref 5–40)

## 2017-10-26 NOTE — Patient Instructions (Addendum)
  I will check cholesterol again to decide if statin is still recommended.  Continue the good work with diet and exercise. Keep trying to cut back on alcohol as discussed. Continue omeprazole daily.  Thanks for coming in today.    IF you received an x-ray today, you will receive an invoice from [facility]iology. Please contact [facility] Radiology at [phone] with questions or concerns regarding your invoice.   IF you received labwork today, you will receive an invoice from [place]. Please contact [facility] at [phone] with questions or concerns regarding your invoice.   Our billing staff will not be able to assist you with questions regarding bills from these companies.  You will be contacted with the lab results as soon as they are available. The fastest way to get your results is to activate your My Chart account. Instructions are located on the last page of this paperwork. If you have not heard from [place] regarding the results in 2 weeks, please contact this office.

## 2017-10-26 NOTE — Progress Notes (Signed)
Subjective:  By signing my name below, I, Christopher Hendricks, attest that this documentation has been prepared under the direction and in the presence of Christopher Ray, MD. Electronically Signed: Moises Hendricks, [place]. 10/26/2017 , 8:58 AM .  Patient was seen in Room 11 .   Patient ID: Christopher Hendricks, male    DOB: June 17, 1945, 72 y.o.   MRN: [id number] Chief Complaint  Patient presents with  . Chronic Condition    6 month f/u   . Hyperlipidemia    Check    HPI Christopher Hendricks is a 72 y.o. male Here for follow up.   Dermatology He last saw his dermatologist, Dr. Elvera Hendricks, about 1.5 years ago, as he has multiple spots throughout his body. He still wears a hat and a shirt when he goes outside.   Hyperlipidemia Lab Results  Component Value Date   CHOL 209 (H) 03/18/2017   HDL 61 03/18/2017   LDLCALC 131 (H) 03/18/2017   TRIG 86 03/18/2017   CHOLHDL 3.4 03/18/2017   Lab Results  Component Value Date   ALT 37 03/18/2017   AST 29 03/18/2017   ALKPHOS 92 03/18/2017   BILITOT 1.2 03/18/2017   He was watching diet, but planned on continuing with work on diet and exercise; recheck in 6 months. He is not on statins.   Patient informs, "instead of eating 2 burgers at once for lunch, I eat 1 burger and 1 bean burger instead". He's been doing about 30 minute walks each day; sometimes substitutes walking with yard work instead.   The 10-year ASCVD risk score Christopher Hendricks DC Christopher Hendricks., et al., 2013) is: 17.8%   Values used to calculate the score:     Age: 14 years     Sex: Male     Is Non-Hispanic African American: No     Diabetic: No     Tobacco smoker: No     Systolic Hendricks Pressure: 992 mmHg     Is BP treated: No     HDL Cholesterol: 61 mg/dL     Total Cholesterol: 209 mg/dL   History of peptic ulcer disease/duodenal ulcer He takes daily PPI by Dr. Fuller Hendricks. He has a history of significant alcohol use, down to 4-5 glasses of wine a night when discussed in Dec. Recommended decrease in  alcohol use with less than 2 drinks a day. Health risks were discussed including with his history of ulcer.   He is still drinking 4-5 glasses of wine a night. He's been taking omeprazole, and no heartburn recently. He denies dark tarry stools, abdominal pain or Hendricks in stool.   Patient Active Problem List   Diagnosis Date Noted  . Duodenal ulcer with hemorrhage 08/18/2012  . Acute posthemorrhagic anemia 08/18/2012  . GI bleed 08/17/2012  . Anemia 08/17/2012  . Alcohol abuse 08/17/2012   Past Medical History:  Diagnosis Date  . Alcoholism /alcohol abuse ([place])   . Allergy   . Arthritis   . Hendricks transfusion without reported diagnosis   . Duodenal ulcer   . Heart murmur    as teen  . Neuromuscular disorder ([place])    pinched nerve in neck that causes numbness in fingers on right hand   . Sigmoid diverticulosis 2007   Past Surgical History:  Procedure Laterality Date  . COLONOSCOPY  2007   sigmoid diverticulosis.  Dr Christopher Hendricks  . ESOPHAGOGASTRODUODENOSCOPY N/A 08/18/2012   Procedure: ESOPHAGOGASTRODUODENOSCOPY (EGD);  Surgeon: Christopher Shipper, MD;  Location: [facility] ENDOSCOPY;  Service: Endoscopy;  Laterality: N/A;  . HERNIA REPAIR    . SMALL INTESTINE SURGERY    . UPPER GASTROINTESTINAL ENDOSCOPY    . VASECTOMY     Allergies  Allergen Reactions  . Codeine Nausea And Vomiting  . Sulfa Antibiotics Rash   Prior to Admission medications   Medication Sig Start Date End Date Taking? Authorizing Provider  Acetaminophen (TYLENOL PO) Take by mouth at bedtime.     [provider]  Biotin (BIOTIN 5000) 5 MG CAPS Take 5,000 mg by mouth every other day.     [provider]  DiphenhydrAMINE HCl (BENADRYL PO) Take 25 mg by mouth at bedtime.     [provider]  glucosamine-chondroitin 500-400 MG tablet Take 1 tablet by mouth daily.    [provider]  Magnesium 500 MG CAPS Take by mouth every other day.     [provider]  Melatonin 5 MG TABS Take by  mouth at bedtime.    [provider]  Multiple Vitamins-Minerals (MULTIVITAMIN PO) Take by mouth daily. chewable     [provider]  omeprazole (PRILOSEC) 20 MG capsule Take 20 mg by mouth daily.    [provider]  OVER THE COUNTER MEDICATION Sleep aide    [provider]  POTASSIUM GLUCONATE PO Take 550 mg by mouth every other day.     [provider]  Probiotic Product (PROBIOTIC DAILY PO) Take by mouth every other day.     [provider]  Zinc 30 MG TABS Take 50 mg by mouth daily.     [provider]   Social History   Socioeconomic History  . Marital status: Married    Spouse name: Not on file  . Number of children: 1  . Years of education: Not on file  . Highest education level: Master's degree (e.g., MA, MS, MEng, MEd, MSW, MBA)  Occupational History  . Occupation: Engineer, maintenance (IT)  Social Needs  . Financial resource strain: Not hard at all  . Food insecurity:    Worry: Never true    Inability: Never true  . Transportation needs:    Medical: No    Non-medical: No  Tobacco Use  . Smoking status: Never Smoker  . Smokeless tobacco: Never Used  Substance and Sexual Activity  . Alcohol use: Yes    Comment: 1 quart of day-5 glasses wine a day   . Drug use: No  . Sexual activity: Yes    Comment: number of sex partners in the last 21 months  1 ([name])  Lifestyle  . Physical activity:    Days per week: 5 days    Minutes per session: 30 min  . Stress: To some extent  Relationships  . Social connections:    Talks on phone: More than three times a week    Gets together: Never    Attends religious service: More than 4 times per year    Active member of club or organization: No    Attends meetings of clubs or organizations: Never    Relationship status: Married  . Intimate partner violence:    Fear of current or ex partner: No    Emotionally abused: No    Physically abused: No    Forced sexual activity: No  Other  Topics Concern  . Not on file  Social History Narrative   Exercise walking 2 times a week for 1 mile   Married   Charity fundraiser   Review of Systems  Constitutional: Negative for fatigue and unexpected weight change.  Eyes: Negative for visual disturbance.  Respiratory: Negative for cough, chest tightness and shortness of breath.   Cardiovascular: Negative for chest pain, palpitations and leg swelling.  Gastrointestinal: Negative for abdominal pain, anal bleeding and Hendricks in stool.  Neurological: Negative for dizziness, light-headedness and headaches.       Objective:   Physical Exam  Constitutional: He is oriented to person, place, and time. He appears well-developed and well-nourished.  HENT:  Head: Normocephalic and atraumatic.  Eyes: Pupils are equal, round, and reactive to light. EOM are normal.  Neck: No JVD present. Carotid bruit is not present.  Cardiovascular: Normal rate, regular rhythm and normal heart sounds.  No murmur heard. Pulmonary/Chest: Effort normal and breath sounds normal. He has no rales.  Musculoskeletal: He exhibits no edema.  Neurological: He is alert and oriented to person, place, and time.  Skin: Skin is warm and dry.  Psychiatric: He has a normal mood and affect.  Vitals reviewed.   Vitals:   10/26/17 0845  BP: 132/74  Pulse: 72  Temp: 98.5 F (36.9 C)  TempSrc: Oral  SpO2: 97%  Weight: 154 lb 6.4 oz (70 kg)  Height: 5' 11.5" (1.816 m)       Assessment & Hendricks:   Christopher Hendricks is a 72 y.o. male Hyperlipidemia, unspecified hyperlipidemia type - Hendricks: Lipid panel, Comprehensive metabolic panel Repeat testing now that he is made some changes in diet and exercise.  Calculate ASCVD risk.  Recommend continued attempts at trying to cut back on alcohol intake, especially with history of duodenal ulcer.  Follow-up in 6 months for physical  No orders of the defined types were placed in this encounter.  Patient Instructions    I will check  cholesterol again to decide if statin is still recommended.  Continue the good work with diet and exercise. Keep trying to cut back on alcohol as discussed. Continue omeprazole daily.  Thanks for coming in today.    IF you received an x-Hendricks today, you will receive an invoice from [facility] Radiology. Please contact [facility] Radiology at [phone] with questions or concerns regarding your invoice.   IF you received labwork today, you will receive an invoice from [place]. Please contact [facility] at [phone] with questions or concerns regarding your invoice.   Our billing staff will not be able to assist you with questions regarding bills from these companies.  You will be contacted with the lab results as soon as they are available. The fastest way to get your results is to activate your My Chart account. Instructions are located on the last page of this paperwork. If you have not heard from [place] regarding the results in 2 weeks, please contact this office.       I personally performed the services described in this documentation, which was scribed in my presence. The recorded information has been reviewed and considered for accuracy and completeness, addended by me as needed, and agree with information above.  Signed,   Christopher Ray, MD Primary Care at [place].  10/26/17 9:06 AM

## 2017-11-11 ENCOUNTER — Encounter: Payer: Self-pay | Admitting: Family Medicine

## 2017-12-15 ENCOUNTER — Encounter: Payer: Self-pay | Admitting: Family Medicine

## 2017-12-15 ENCOUNTER — Other Ambulatory Visit: Payer: Self-pay

## 2017-12-15 ENCOUNTER — Ambulatory Visit (INDEPENDENT_AMBULATORY_CARE_PROVIDER_SITE_OTHER): Payer: PPO | Admitting: Family Medicine

## 2017-12-15 VITALS — BP 126/72 | HR 62 | Temp 97.4°F | Ht 71.5 in | Wt 157.0 lb

## 2017-12-15 DIAGNOSIS — K4090 Unilateral inguinal hernia, without obstruction or gangrene, not specified as recurrent: Secondary | ICD-10-CM

## 2017-12-15 NOTE — Progress Notes (Signed)
Subjective:    Patient ID: Christopher Hendricks, male    DOB: 1946-02-12, 72 y.o.   MRN: [id number]  HPI Christopher Hendricks is a 72 y.o. male Presents today for: Chief Complaint  Patient presents with  . Pain    Pain on the  right side the groin area, thinks he may have hernia   Here for possible hernia. Did some edging in yard about a month ago. No known straining or heavy lifting. NKI.  Felt some soreness in inside of R thigh, but no visible bruising, with some swelling in groin. Swelling persistent, maybe bigger past few weeks. Uncomfortable.  No bowel changes. BM usually QD, looser stool with BID BM's past week.   Tx: none.   Hernia repair at 72yo in L groin.    Patient Active Problem List   Diagnosis Date Noted  . Duodenal ulcer with hemorrhage 08/18/2012  . Acute posthemorrhagic anemia 08/18/2012  . GI bleed 08/17/2012  . Anemia 08/17/2012  . Alcohol abuse 08/17/2012   Past Medical History:  Diagnosis Date  . Alcoholism /alcohol abuse ([place])   . Allergy   . Arthritis   . Blood transfusion without reported diagnosis   . Duodenal ulcer   . Heart murmur    as teen  . Neuromuscular disorder ([place])    pinched nerve in neck that causes numbness in fingers on right hand   . Sigmoid diverticulosis 2007   Past Surgical History:  Procedure Laterality Date  . COLONOSCOPY  2007   sigmoid diverticulosis.  Dr Fuller Plan  . ESOPHAGOGASTRODUODENOSCOPY N/A 08/18/2012   Procedure: ESOPHAGOGASTRODUODENOSCOPY (EGD);  Surgeon: Irene Shipper, MD;  Location: [facility] ENDOSCOPY;  Service: Endoscopy;  Laterality: N/A;  . HERNIA REPAIR    . SMALL INTESTINE SURGERY    . UPPER GASTROINTESTINAL ENDOSCOPY    . VASECTOMY     Allergies  Allergen Reactions  . Codeine Nausea And Vomiting  . Sulfa Antibiotics Rash   Prior to Admission medications   Medication Sig Start Date End Date Taking? Authorizing Provider  Acetaminophen (TYLENOL PO) Take by mouth at bedtime.    Yes [provider]    DiphenhydrAMINE HCl (BENADRYL PO) Take 25 mg by mouth at bedtime.    Yes [provider]  Magnesium 500 MG CAPS Take by mouth every other day.    Yes [provider]  Melatonin 5 MG TABS Take by mouth at bedtime.   Yes [provider]  Multiple Vitamins-Minerals (MULTIVITAMIN PO) Take by mouth daily. chewable    Yes [provider]  omeprazole (PRILOSEC) 20 MG capsule Take 20 mg by mouth daily.   Yes [provider]  POTASSIUM GLUCONATE PO Take 550 mg by mouth every other day.    Yes [provider]  Probiotic Product (PROBIOTIC DAILY PO) Take by mouth every other day.    Yes [provider]  Zinc 30 MG TABS Take 50 mg by mouth daily.    Yes [provider]   Social History   Socioeconomic History  . Marital status: Married    Spouse name: Not on file  . Number of children: 1  . Years of education: Not on file  . Highest education level: Master's degree (e.g., MA, MS, MEng, MEd, MSW, MBA)  Occupational History  . Occupation: Engineer, maintenance (IT)  Social Needs  . Financial resource strain: Not hard at all  . Food insecurity:    Worry: Never true    Inability: Never true  .  Transportation needs:    Medical: No    Non-medical: No  Tobacco Use  . Smoking status: Never Smoker  . Smokeless tobacco: Never Used  Substance and Sexual Activity  . Alcohol use: Yes    Comment: 1 quart of day-5 glasses wine a day   . Drug use: No  . Sexual activity: Yes    Comment: number of sex partners in the last 26 months  1 ([name])  Lifestyle  . Physical activity:    Days per week: 5 days    Minutes per session: 30 min  . Stress: To some extent  Relationships  . Social connections:    Talks on phone: More than three times a week    Gets together: Never    Attends religious service: More than 4 times per year    Active member of club or organization: No    Attends meetings of clubs or organizations: Never    Relationship status:  Married  . Intimate partner violence:    Fear of current or ex partner: No    Emotionally abused: No    Physically abused: No    Forced sexual activity: No  Other Topics Concern  . Not on file  Social History Narrative   Exercise walking 2 times a week for 1 mile   Married   Engineer, maintenance (IT)   MBA    Review of Systems  Constitutional: Negative for chills and fever.  Gastrointestinal: Negative for abdominal distention, anal bleeding, blood in stool, constipation, nausea and vomiting.  Genitourinary: Negative for difficulty urinating, dysuria and hematuria.       Objective:   Physical Exam  Constitutional: He is oriented to person, place, and time. He appears well-developed and well-nourished. No distress.  HENT:  Head: Normocephalic and atraumatic.  Cardiovascular: Normal rate.  Pulmonary/Chest: Effort normal.  Abdominal: He exhibits no distension. There is no tenderness. There is no rigidity and no guarding. A hernia is present. Hernia confirmed positive in the right inguinal area.    Neurological: He is alert and oriented to person, place, and time.  Psychiatric: He has a normal mood and affect.  Vitals reviewed.  Vitals:   12/15/17 1322 12/15/17 1356  BP: (!) 151/74 126/72  Pulse: 62   Temp: (!) 97.4 F (36.3 C)   TempSrc: Oral   SpO2: 99%   Weight: 157 lb (71.2 kg)   Height: 5' 11.5" (1.816 m)         Assessment & Plan:  Christopher Hendricks is a 71 y.o. male Right inguinal hernia - Plan: Ambulatory referral to General Surgery  - suspected new onset R inguinal hernia, without signs of incarceration.  Reducible on exam.  -Handout given on hernia, RTC/ER precautions given for potential symptoms of incarcerated or strangulated hernia, refer to general surgery.  Avoid straining, heavy lifting for now.  No orders of the defined types were placed in this encounter.  Patient Instructions    Hernia, Adult A hernia is the bulging of an organ or tissue through a weak spot in  the muscles of the abdomen (abdominal wall). Hernias develop most often near the navel or groin. There are many kinds of hernias. Common kinds include:  Femoral hernia. This kind of hernia develops under the groin in the upper thigh area.  Inguinal hernia. This kind of hernia develops in the groin or scrotum.  Umbilical hernia. This kind of hernia develops near the navel.  Hiatal hernia. This kind of hernia causes part of the  stomach to be pushed up into the chest.  Incisional hernia. This kind of hernia bulges through a scar from an abdominal surgery.  What are the causes? This condition may be caused by:  Heavy lifting.  Coughing over a long period of time.  Straining to have a bowel movement.  An incision made during an abdominal surgery.  A birth defect (congenital defect).  Excess weight or obesity.  Smoking.  Poor nutrition.  Cystic fibrosis.  Excess fluid in the abdomen.  Undescended testicles.  What are the signs or symptoms? Symptoms of a hernia include:  A lump on the abdomen. This is the first sign of a hernia. The lump may become more obvious with standing, straining, or coughing. It may get bigger over time if it is not treated or if the condition causing it is not treated.  Pain. A hernia is usually painless, but it may become painful over time if treatment is delayed. The pain is usually dull and may get worse with standing or lifting heavy objects.  Sometimes a hernia gets tightly squeezed in the weak spot (strangulated) or stuck there (incarcerated) and causes additional symptoms. These symptoms may include:  Vomiting.  Nausea.  Constipation.  Irritability.  How is this diagnosed? A hernia may be diagnosed with:  A physical exam. During the exam your health care provider may ask you to cough or to make a specific movement, because a hernia is usually more visible when you move.  Imaging tests. These can  include: ? X-rays. ? Ultrasound. ? CT scan.  How is this treated? A hernia that is small and painless may not need to be treated. A hernia that is large or painful may be treated with surgery. Inguinal hernias may be treated with surgery to prevent incarceration or strangulation. Strangulated hernias are always treated with surgery, because lack of blood to the trapped organ or tissue can cause it to die. Surgery to treat a hernia involves pushing the bulge back into place and repairing the weak part of the abdomen. Follow these instructions at home:  Avoid straining.  Do not lift anything heavier than 10 lb (4.5 kg).  Lift with your leg muscles, not your back muscles. This helps avoid strain.  When coughing, try to cough gently.  Prevent constipation. Constipation leads to straining with bowel movements, which can make a hernia worse or cause a hernia repair to break down. You can prevent constipation by: ? Eating a high-fiber diet that includes plenty of fruits and vegetables. ? Drinking enough fluids to keep your urine clear or pale yellow. Aim to drink 6-8 glasses of water per day. ? Using a stool softener as directed by your health care provider.  Lose weight, if you are overweight.  Do not use any tobacco products, including cigarettes, chewing tobacco, or electronic cigarettes. If you need help quitting, ask your health care provider.  Keep all follow-up visits as directed by your health care provider. This is important. Your health care provider may need to monitor your condition. Contact a health care provider if:  You have swelling, redness, and pain in the affected area.  Your bowel habits change. Get help right away if:  You have a fever.  You have abdominal pain that is getting worse.  You feel nauseous or you vomit.  You cannot push the hernia back in place by gently pressing on it while you are lying down.  The hernia: ? Changes in shape or size. ? Is stuck  outside the abdomen. ? Becomes discolored. ? Feels hard or tender. This information is not intended to replace advice given to you by your health care provider. Make sure you discuss any questions you have with your health care provider. Document Released: 03/31/2005 Document Revised: 08/29/2015 Document Reviewed: 02/08/2014 Elsevier Interactive Patient Education  2017 [employer].    If you have lab work done today you will be contacted with your lab results within the next 2 weeks.  If you have not heard from [place] then please contact us. The fastest way to get your results is to register for My Chart.   IF you received an x-ray today, you will receive an invoice from [facility]. Please contact [facility] at [phone] with questions or concerns regarding your invoice.   IF you received labwork today, you will receive an invoice from [facility]. Please contact [facility] at [phone] with questions or concerns regarding your invoice.   Our billing staff will not be able to assist you with questions regarding bills from these companies.  You will be contacted with the lab results as soon as they are available. The fastest way to get your results is to activate your My Chart account. Instructions are located on the last page of this paperwork. If you have not heard from [place] regarding the results in 2 weeks, please contact this office.      Signed,   Merri Ray, MD [facility].  12/15/17 2:00 PM

## 2017-12-15 NOTE — Patient Instructions (Addendum)
Hernia, Adult A hernia is the bulging of an organ or tissue through a weak spot in the muscles of the abdomen (abdominal wall). Hernias develop most often near the navel or groin. There are many kinds of hernias. Common kinds include:  Femoral hernia. This kind of hernia develops under the groin in the upper thigh area.  Inguinal hernia. This kind of hernia develops in the groin or scrotum.  Umbilical hernia. This kind of hernia develops near the navel.  Hiatal hernia. This kind of hernia causes part of the stomach to be pushed up into the chest.  Incisional hernia. This kind of hernia bulges through a scar from an abdominal surgery.  What are the causes? This condition may be caused by:  Heavy lifting.  Coughing over a long period of time.  Straining to have a bowel movement.  An incision made during an abdominal surgery.  A birth defect (congenital defect).  Excess weight or obesity.  Smoking.  Poor nutrition.  Cystic fibrosis.  Excess fluid in the abdomen.  Undescended testicles.  What are the signs or symptoms? Symptoms of a hernia include:  A lump on the abdomen. This is the first sign of a hernia. The lump may become more obvious with standing, straining, or coughing. It may get bigger over time if it is not treated or if the condition causing it is not treated.  Pain. A hernia is usually painless, but it may become painful over time if treatment is delayed. The pain is usually dull and may get worse with standing or lifting heavy objects.  Sometimes a hernia gets tightly squeezed in the weak spot (strangulated) or stuck there (incarcerated) and causes additional symptoms. These symptoms may include:  Vomiting.  Nausea.  Constipation.  Irritability.  How is this diagnosed? A hernia may be diagnosed with:  A physical exam. During the exam your health care provider may ask you to cough or to make a specific movement, because a hernia is usually more  visible when you move.  Imaging tests. These can include: ? X-rays. ? Ultrasound. ? CT scan.  How is this treated? A hernia that is small and painless may not need to be treated. A hernia that is large or painful may be treated with surgery. Inguinal hernias may be treated with surgery to prevent incarceration or strangulation. Strangulated hernias are always treated with surgery, because lack of blood to the trapped organ or tissue can cause it to die. Surgery to treat a hernia involves pushing the bulge back into place and repairing the weak part of the abdomen. Follow these instructions at home:  Avoid straining.  Do not lift anything heavier than 10 lb (4.5 kg).  Lift with your leg muscles, not your back muscles. This helps avoid strain.  When coughing, try to cough gently.  Prevent constipation. Constipation leads to straining with bowel movements, which can make a hernia worse or cause a hernia repair to break down. You can prevent constipation by: ? Eating a high-fiber diet that includes plenty of fruits and vegetables. ? Drinking enough fluids to keep your urine clear or pale yellow. Aim to drink 6-8 glasses of water per day. ? Using a stool softener as directed by your health care provider.  Lose weight, if you are overweight.  Do not use any tobacco products, including cigarettes, chewing tobacco, or electronic cigarettes. If you need help quitting, ask your health care provider.  Keep all follow-up visits as directed by your health care  provider. This is important. Your health care provider may need to monitor your condition. Contact a health care provider if:  You have swelling, redness, and pain in the affected area.  Your bowel habits change. Get help right away if:  You have a fever.  You have abdominal pain that is getting worse.  You feel nauseous or you vomit.  You cannot push the hernia back in place by gently pressing on it while you are lying  down.  The hernia: ? Changes in shape or size. ? Is stuck outside the abdomen. ? Becomes discolored. ? Feels hard or tender. This information is not intended to replace advice given to you by your health care provider. Make sure you discuss any questions you have with your health care provider. Document Released: 03/31/2005 Document Revised: 08/29/2015 Document Reviewed: 02/08/2014 Elsevier Interactive Patient Education  2017 [employer].    If you have lab work done today you will be contacted with your lab results within the next 2 weeks.  If you have not heard from [place] then please contact us. The fastest way to get your results is to register for My Chart.   IF you received an x-ray today, you will receive an invoice from [facility]. Please contact [facility] Radiology at [phone] with questions or concerns regarding your invoice.   IF you received labwork today, you will receive an invoice from [facility]. Please contact [facility] at [phone] with questions or concerns regarding your invoice.   Our billing staff will not be able to assist you with questions regarding bills from these companies.  You will be contacted with the lab results as soon as they are available. The fastest way to get your results is to activate your My Chart account. Instructions are located on the last page of this paperwork. If you have not heard from [place] regarding the results in 2 weeks, please contact this office.

## 2018-01-14 DIAGNOSIS — L814 Other melanin hyperpigmentation: Secondary | ICD-10-CM | POA: Diagnosis not present

## 2018-01-14 DIAGNOSIS — D225 Melanocytic nevi of trunk: Secondary | ICD-10-CM | POA: Diagnosis not present

## 2018-01-14 DIAGNOSIS — D0461 Carcinoma in situ of skin of right upper limb, including shoulder: Secondary | ICD-10-CM | POA: Diagnosis not present

## 2018-01-14 DIAGNOSIS — L821 Other seborrheic keratosis: Secondary | ICD-10-CM | POA: Diagnosis not present

## 2018-01-14 DIAGNOSIS — L570 Actinic keratosis: Secondary | ICD-10-CM | POA: Diagnosis not present

## 2018-03-23 ENCOUNTER — Encounter: Payer: Self-pay | Admitting: Family Medicine

## 2018-03-31 ENCOUNTER — Other Ambulatory Visit: Payer: Self-pay

## 2018-03-31 ENCOUNTER — Ambulatory Visit (INDEPENDENT_AMBULATORY_CARE_PROVIDER_SITE_OTHER): Payer: PPO | Admitting: Family Medicine

## 2018-03-31 ENCOUNTER — Encounter: Payer: Self-pay | Admitting: Family Medicine

## 2018-03-31 VITALS — BP 136/74 | HR 66 | Temp 97.6°F | Resp 18 | Ht 71.0 in | Wt 155.0 lb

## 2018-03-31 DIAGNOSIS — Z125 Encounter for screening for malignant neoplasm of prostate: Secondary | ICD-10-CM | POA: Diagnosis not present

## 2018-03-31 DIAGNOSIS — K4090 Unilateral inguinal hernia, without obstruction or gangrene, not specified as recurrent: Secondary | ICD-10-CM

## 2018-03-31 DIAGNOSIS — Z0000 Encounter for general adult medical examination without abnormal findings: Secondary | ICD-10-CM | POA: Diagnosis not present

## 2018-03-31 DIAGNOSIS — E785 Hyperlipidemia, unspecified: Secondary | ICD-10-CM

## 2018-03-31 NOTE — Patient Instructions (Addendum)
Schedule eye doctor visit.   I will check some labs including cholesterol today.  I placed another referral to the surgeon, but let me know if I need to assist further. Thanks for coming in today.   Preventive Care 11 Years and Older, Male Preventive care refers to lifestyle choices and visits with your health care provider that can promote health and wellness. What does preventive care include?   A yearly physical exam. This is also called an annual well check.  Dental exams once or twice a year.  Routine eye exams. Ask your health care provider how often you should have your eyes checked.  Personal lifestyle choices, including: ? Daily care of your teeth and gums. ? Regular physical activity. ? Eating a healthy diet. ? Avoiding tobacco and drug use. ? Limiting alcohol use. ? Practicing safe sex. ? Taking low doses of aspirin every day. ? Taking vitamin and mineral supplements as recommended by your health care provider. What happens during an annual well check? The services and screenings done by your health care provider during your annual well check will depend on your age, overall health, lifestyle risk factors, and family history of disease. Counseling Your health care provider may ask you questions about your:  Alcohol use.  Tobacco use.  Drug use.  Emotional well-being.  Home and relationship well-being.  Sexual activity.  Eating habits.  History of falls.  Memory and ability to understand (cognition).  Work and work Statistician. Screening You may have the following tests or measurements:  Height, weight, and BMI.  Blood pressure.  Lipid and cholesterol levels. These may be checked every 5 years, or more frequently if you are over 68 years old.  Skin check.  Lung cancer screening. You may have this screening every year starting at age 73 if you have a 30-pack-year history of smoking and currently smoke or have quit within the past 15  years.  Colorectal cancer screening. All adults should have this screening starting at age 25 and continuing until age 71. You will have tests every 1-10 years, depending on your results and the type of screening test. People at increased risk should start screening at an earlier age. Screening tests may include: ? Guaiac-based fecal occult blood testing. ? Fecal immunochemical test (FIT). ? Stool DNA test. ? Virtual colonoscopy. ? Sigmoidoscopy. During this test, a flexible tube with a tiny camera (sigmoidoscope) is used to examine your rectum and lower colon. The sigmoidoscope is inserted through your anus into your rectum and lower colon. ? Colonoscopy. During this test, a long, thin, flexible tube with a tiny camera (colonoscope) is used to examine your entire colon and rectum.  Prostate cancer screening. Recommendations will vary depending on your family history and other risks.  Hepatitis C blood test.  Hepatitis B blood test.  Sexually transmitted disease (STD) testing.  Diabetes screening. This is done by checking your blood sugar (glucose) after you have not eaten for a while (fasting). You may have this done every 1-3 years.  Abdominal aortic aneurysm (AAA) screening. You may need this if you are a current or former smoker.  Osteoporosis. You may be screened starting at age 1 if you are at high risk. Talk with your health care provider about your test results, treatment options, and if necessary, the need for more tests. Vaccines Your health care provider may recommend certain vaccines, such as:  Influenza vaccine. This is recommended every year.  Tetanus, diphtheria, and acellular pertussis (Tdap, Td) vaccine.  You may need a Td booster every 10 years.  Varicella vaccine. You may need this if you have not been vaccinated.  Zoster vaccine. You may need this after age 37.  Measles, mumps, and rubella (MMR) vaccine. You may need at least one dose of MMR if you were born in  1956 or later. You may also need a second dose.  Pneumococcal 13-valent conjugate (PCV13) vaccine. One dose is recommended after age 54.  Pneumococcal polysaccharide (PPSV23) vaccine. One dose is recommended after age 27.  Meningococcal vaccine. You may need this if you have certain conditions.  Hepatitis A vaccine. You may need this if you have certain conditions or if you travel or work in places where you may be exposed to hepatitis A.  Hepatitis B vaccine. You may need this if you have certain conditions or if you travel or work in places where you may be exposed to hepatitis B.  Haemophilus influenzae type b (Hib) vaccine. You may need this if you have certain risk factors. Talk to your health care provider about which screenings and vaccines you need and how often you need them. This information is not intended to replace advice given to you by your health care provider. Make sure you discuss any questions you have with your health care provider. Document Released: 04/27/2015 Document Revised: 05/21/2017 Document Reviewed: 01/30/2015 Elsevier Interactive Patient Education  [employer].   If you have lab work done today you will be contacted with your lab results within the next 2 weeks.  If you have not heard from [place] then please contact us. The fastest way to get your results is to register for My Chart.   IF you received an x-ray today, you will receive an invoice from [facility]iology. Please contact [facility]iology at [phone] with questions or concerns regarding your invoice.   IF you received labwork today, you will receive an invoice from [place]. Please contact [facility] at [phone] with questions or concerns regarding your invoice.   Our billing staff will not be able to assist you with questions regarding bills from these companies.  You will be contacted with the lab results as soon as they are available. The fastest way to get your results is to  activate your My Chart account. Instructions are located on the last page of this paperwork. If you have not heard from [place] regarding the results in 2 weeks, please contact this office.

## 2018-03-31 NOTE — Progress Notes (Signed)
Subjective:    Patient ID: Christopher Hendricks, male    DOB: 04/22/1945, 72 y.o.   MRN: [id number]  HPI Christopher Hendricks is a 72 y.o. male Presents today for: Chief Complaint  Patient presents with  . Annual Exam   R inguinal hernia: Diagnosed in September and referred to general surgery. Did not hear form surgeon's office. No new redness, pain or difficulty reducing.   Hyperlipidemia: 10-year ASCVD risk of 17% in July.  Did have some improvement from previous readings, but recommended statin. Decided against statin. Would consider if total over 200.  Would like ot see reading again.  Lab Results  Component Value Date   CHOL 189 10/26/2017   HDL 58 10/26/2017   LDLCALC 115 (H) 10/26/2017   TRIG 81 10/26/2017   CHOLHDL 3.3 10/26/2017   Lab Results  Component Value Date   ALT 28 10/26/2017   AST 22 10/26/2017   ALKPHOS 74 10/26/2017   BILITOT 1.4 (H) 10/26/2017   Alcohol use/abuse: Drinking 4 to 5 glasses of wine per night when discussed previously. Still drinking 4-5 glasses per night. No DUI, no legal issues or family/friend issues with drinking.   Still working - considering retiring in few years.  Cancer screening: Colonoscopy 03/03/2016 plan repeat in 5 years. PSA 1.4 in December 2018.   Skin: [place], had SCC on back, treatments on scalp. Followed regularly.   Immunization History  Administered Date(s) Administered  . Influenza Split 02/24/2012  . Influenza,inj,Quad PF,6+ Mos 03/01/2013, 03/10/2014, 03/01/2015, 03/13/2016, 03/18/2017  . Pneumococcal Conjugate-13 03/01/2015  . Pneumococcal Polysaccharide-23 08/18/2012  . Zoster 09/18/2012  flu: had done recenlty at flu clinic.   No falls in the past year.   Depression screen [facility] 2/9 03/31/2018 12/15/2017 12/15/2017 10/26/2017 03/30/2017  Decreased Interest 0 0 0 0 0  Down, Depressed, Hopeless 0 0 0 0 0  PHQ - 2 Score 0 0 0 0 0   Functional Status Survey: Is the patient deaf or have difficulty hearing?:  No Does the patient have difficulty seeing, even when wearing glasses/contacts?: Yes Does the patient have difficulty concentrating, remembering, or making decisions?: No Does the patient have difficulty walking or climbing stairs?: No Does the patient have difficulty dressing or bathing?: No Does the patient have difficulty doing errands alone such as visiting a doctor's office or shopping?: No  6CIT Screen 03/31/2018 03/18/2017  What Year? 0 points 0 points  What month? 0 points 0 points  What time? 0 points 0 points  Count back from 20 0 points 0 points  Months in reverse 0 points 0 points  Repeat phrase 0 points 2 points  Total Score 0 2    Visual Acuity Screening   Right eye Left eye Both eyes  Without correction: 20/20 20/50 20/20   With correction:     no recent optho eval.   Dental: last seen in May  Exercise: most days per week.   Advanced Directives: He does have advanced directive and changes were declined  Patient Active Problem List   Diagnosis Date Noted  . Duodenal ulcer with hemorrhage 08/18/2012  . Acute posthemorrhagic anemia 08/18/2012  . GI bleed 08/17/2012  . Anemia 08/17/2012  . Alcohol abuse 08/17/2012   Past Medical History:  Diagnosis Date  . Alcoholism /alcohol abuse ([place])   . Allergy   . Arthritis   . Blood transfusion without reported diagnosis   . Duodenal ulcer   . Heart murmur    as teen  .  Neuromuscular disorder ([place])    pinched nerve in neck that causes numbness in fingers on right hand   . Sigmoid diverticulosis 2007   Past Surgical History:  Procedure Laterality Date  . COLONOSCOPY  2007   sigmoid diverticulosis.  Dr Fuller Plan  . ESOPHAGOGASTRODUODENOSCOPY N/A 08/18/2012   Procedure: ESOPHAGOGASTRODUODENOSCOPY (EGD);  Surgeon: Irene Shipper, MD;  Location: [facility];  Service: Endoscopy;  Laterality: N/A;  . HERNIA REPAIR    . SMALL INTESTINE SURGERY    . UPPER GASTROINTESTINAL ENDOSCOPY    . VASECTOMY     Allergies  Allergen  Reactions  . Codeine Nausea And Vomiting  . Sulfa Antibiotics Rash   Prior to Admission medications   Medication Sig Start Date End Date Taking? Authorizing Provider  Acetaminophen (TYLENOL PO) Take by mouth at bedtime.    Yes [provider]  DiphenhydrAMINE HCl (BENADRYL PO) Take 25 mg by mouth at bedtime.    Yes [provider]  Magnesium 500 MG CAPS Take by mouth every other day.    Yes [provider]  Melatonin 5 MG TABS Take by mouth at bedtime.   Yes [provider]  Multiple Vitamins-Minerals (MULTIVITAMIN PO) Take by mouth daily. chewable    Yes [provider]  omeprazole (PRILOSEC) 20 MG capsule Take 20 mg by mouth daily.   Yes [provider]  POTASSIUM GLUCONATE PO Take 550 mg by mouth every other day.    Yes [provider]  Probiotic Product (PROBIOTIC DAILY PO) Take by mouth every other day.    Yes [provider]  Zinc 30 MG TABS Take 50 mg by mouth daily.    Yes [provider]   Social History   Socioeconomic History  . Marital status: Married    Spouse name: Not on file  . Number of children: 1  . Years of education: Not on file  . Highest education level: Master's degree (e.g., MA, MS, MEng, MEd, MSW, MBA)  Occupational History  . Occupation: Engineer, maintenance (IT)  Social Needs  . Financial resource strain: Not hard at all  . Food insecurity:    Worry: Never true    Inability: Never true  . Transportation needs:    Medical: No    Non-medical: No  Tobacco Use  . Smoking status: Never Smoker  . Smokeless tobacco: Never Used  Substance and Sexual Activity  . Alcohol use: Yes    Comment: 1 quart of day-5 glasses wine a day   . Drug use: No  . Sexual activity: Yes    Comment: number of sex partners in the last 58 months  1 ([name])  Lifestyle  . Physical activity:    Days per week: 5 days    Minutes per session: 30 min  . Stress: To some extent  Relationships  . Social connections:     Talks on phone: More than three times a week    Gets together: Never    Attends religious service: More than 4 times per year    Active member of club or organization: No    Attends meetings of clubs or organizations: Never    Relationship status: Married  . Intimate partner violence:    Fear of current or ex partner: No    Emotionally abused: No    Physically abused: No    Forced sexual activity: No  Other Topics Concern  . Not on file  Social History Narrative   Exercise walking 2 times a week for  1 mile   Married   Charity fundraiser    Review of Systems 13 point review of systems per patient health survey noted.  Negative other than as indicated above or in HPI.      Objective:   Physical Exam Vitals signs reviewed.  Constitutional:      Appearance: He is well-developed.  HENT:     Head: Normocephalic and atraumatic.     Right Ear: External ear normal.     Left Ear: External ear normal.  Eyes:     Conjunctiva/sclera: Conjunctivae normal.     Pupils: Pupils are equal, round, and reactive to light.  Neck:     Musculoskeletal: Normal range of motion and neck supple.     Thyroid: No thyromegaly.  Cardiovascular:     Rate and Rhythm: Normal rate and regular rhythm.     Heart sounds: Normal heart sounds.  Pulmonary:     Effort: Pulmonary effort is normal. No respiratory distress.     Breath sounds: Normal breath sounds. No wheezing.  Abdominal:     General: There is no distension.     Palpations: Abdomen is soft.     Tenderness: There is no abdominal tenderness.     Hernia: There is no hernia in the right inguinal area or left inguinal area.  Genitourinary:    Prostate: Normal.  Musculoskeletal: Normal range of motion.        General: No tenderness.  Lymphadenopathy:     Cervical: No cervical adenopathy.  Skin:    General: Skin is warm and dry.  Neurological:     Mental Status: He is alert and oriented to person, place, and time.     Deep Tendon Reflexes: Reflexes are  normal and symmetric.  Psychiatric:        Behavior: Behavior normal.    Few nonthrombosed external hemorrhoids. Vitals:   03/31/18 0813  BP: 136/74  Pulse: 66  Resp: 18  Temp: 97.6 F (36.4 C)  TempSrc: Oral  SpO2: 99%  Weight: 155 lb (70.3 kg)  Height: 5' 11"  (1.803 m)       Assessment & Plan:   Christopher Hendricks is a 72 y.o. male Medicare annual wellness visit, subsequent  - - anticipatory guidance as below in AVS, screening labs if needed. Health maintenance items as above in HPI discussed/recommended as applicable.  - no concerning responses on depression, fall, or functional status screening. Any positive responses noted as above. Advanced directives discussed as in CHL.   Right inguinal hernia - Plan: Ambulatory referral to General Surgery  - reducible, no concerns. Referral placed to surgeon again.   Hyperlipidemia, unspecified hyperlipidemia type - Plan: Comprehensive metabolic panel, Lipid panel  -Previous ASCVD risk discussed, recheck labs to determine recommendations.  Choosing not to start statin at this time.  Screening for prostate cancer - Plan: PSA  - We discussed pros and cons of prostate cancer screening, and after this discussion, he chose to have screening done. PSA obtained, and no concerning findings on DRE.    No orders of the defined types were placed in this encounter.  Patient Instructions   Schedule eye doctor visit.   I will check some labs including cholesterol today.  I placed another referral to the surgeon, but let me know if I need to assist further. Thanks for coming in today.   Preventive Care 70 Years and Older, Male Preventive care refers to lifestyle choices and visits with your health care provider that  can promote health and wellness. What does preventive care include?   A yearly physical exam. This is also called an annual well check.  Dental exams once or twice a year.  Routine eye exams. Ask your health care  provider how often you should have your eyes checked.  Personal lifestyle choices, including: ? Daily care of your teeth and gums. ? Regular physical activity. ? Eating a healthy diet. ? Avoiding tobacco and drug use. ? Limiting alcohol use. ? Practicing safe sex. ? Taking low doses of aspirin every day. ? Taking vitamin and mineral supplements as recommended by your health care provider. What happens during an annual well check? The services and screenings done by your health care provider during your annual well check will depend on your age, overall health, lifestyle risk factors, and family history of disease. Counseling Your health care provider may ask you questions about your:  Alcohol use.  Tobacco use.  Drug use.  Emotional well-being.  Home and relationship well-being.  Sexual activity.  Eating habits.  History of falls.  Memory and ability to understand (cognition).  Work and work Statistician. Screening You may have the following tests or measurements:  Height, weight, and BMI.  Blood pressure.  Lipid and cholesterol levels. These may be checked every 5 years, or more frequently if you are over 53 years old.  Skin check.  Lung cancer screening. You may have this screening every year starting at age 1 if you have a 30-pack-year history of smoking and currently smoke or have quit within the past 15 years.  Colorectal cancer screening. All adults should have this screening starting at age 74 and continuing until age 72. You will have tests every 1-10 years, depending on your results and the type of screening test. People at increased risk should start screening at an earlier age. Screening tests may include: ? Guaiac-based fecal occult blood testing. ? Fecal immunochemical test (FIT). ? Stool DNA test. ? Virtual colonoscopy. ? Sigmoidoscopy. During this test, a flexible tube with a tiny camera (sigmoidoscope) is used to examine your rectum and lower  colon. The sigmoidoscope is inserted through your anus into your rectum and lower colon. ? Colonoscopy. During this test, a long, thin, flexible tube with a tiny camera (colonoscope) is used to examine your entire colon and rectum.  Prostate cancer screening. Recommendations will vary depending on your family history and other risks.  Hepatitis C blood test.  Hepatitis B blood test.  Sexually transmitted disease (STD) testing.  Diabetes screening. This is done by checking your blood sugar (glucose) after you have not eaten for a while (fasting). You may have this done every 1-3 years.  Abdominal aortic aneurysm (AAA) screening. You may need this if you are a current or former smoker.  Osteoporosis. You may be screened starting at age 62 if you are at high risk. Talk with your health care provider about your test results, treatment options, and if necessary, the need for more tests. Vaccines Your health care provider may recommend certain vaccines, such as:  Influenza vaccine. This is recommended every year.  Tetanus, diphtheria, and acellular pertussis (Tdap, Td) vaccine. You may need a Td booster every 10 years.  Varicella vaccine. You may need this if you have not been vaccinated.  Zoster vaccine. You may need this after age 76.  Measles, mumps, and rubella (MMR) vaccine. You may need at least one dose of MMR if you were born in 1957 or later. You may also  need a second dose.  Pneumococcal 13-valent conjugate (PCV13) vaccine. One dose is recommended after age 109.  Pneumococcal polysaccharide (PPSV23) vaccine. One dose is recommended after age 71.  Meningococcal vaccine. You may need this if you have certain conditions.  Hepatitis A vaccine. You may need this if you have certain conditions or if you travel or work in places where you may be exposed to hepatitis A.  Hepatitis B vaccine. You may need this if you have certain conditions or if you travel or work in places where you  may be exposed to hepatitis B.  Haemophilus influenzae type b (Hib) vaccine. You may need this if you have certain risk factors. Talk to your health care provider about which screenings and vaccines you need and how often you need them. This information is not intended to replace advice given to you by your health care provider. Make sure you discuss any questions you have with your health care provider. Document Released: 04/27/2015 Document Revised: 05/21/2017 Document Reviewed: 01/30/2015 Elsevier Interactive Patient Education  [employer].   If you have lab work done today you will be contacted with your lab results within the next 2 weeks.  If you have not heard from [place] then please contact us. The fastest way to get your results is to register for My Chart.   IF you received an x-ray today, you will receive an invoice from [facility]. Please contact [facility] at [phone] with questions or concerns regarding your invoice.   IF you received labwork today, you will receive an invoice from [facility]. Please contact [facility] at [phone] with questions or concerns regarding your invoice.   Our billing staff will not be able to assist you with questions regarding bills from these companies.  You will be contacted with the lab results as soon as they are available. The fastest way to get your results is to activate your My Chart account. Instructions are located on the last page of this paperwork. If you have not heard from [place] regarding the results in 2 weeks, please contact this office.      Signed,   Merri Ray, MD [facility].  03/31/18 8:51 AM

## 2018-04-01 LAB — LIPID PANEL
Chol/HDL Ratio: 3 ratio (ref 0.0–5.0)
Cholesterol, Total: 197 mg/dL (ref 100–199)
HDL: 66 mg/dL (ref >39)
LDL Calculated: 111 mg/dL — ABNORMAL HIGH (ref 0–99)
Triglycerides: 99 mg/dL (ref 0–149)
VLDL CHOLESTEROL CAL: 20 mg/dL (ref 5–40)

## 2018-04-01 LAB — COMPREHENSIVE METABOLIC PANEL
ALBUMIN: 4.9 g/dL — AB (ref 3.5–4.8)
ALT: 28 IU/L (ref 0–44)
AST: 26 IU/L (ref 0–40)
Albumin/Globulin Ratio: 2.1 (ref 1.2–2.2)
Alkaline Phosphatase: 100 IU/L (ref 39–117)
BUN/Creatinine Ratio: 11 (ref 10–24)
BUN: 10 mg/dL (ref 8–27)
Bilirubin Total: 1.5 mg/dL — ABNORMAL HIGH (ref 0.0–1.2)
CO2: 27 mmol/L (ref 20–29)
Calcium: 9.8 mg/dL (ref 8.6–10.2)
Chloride: 99 mmol/L (ref 96–106)
Creatinine, Ser: 0.9 mg/dL (ref 0.76–1.27)
GFR calc Af Amer: 99 mL/min/{1.73_m2} (ref >59)
GFR calc non Af Amer: 86 mL/min/{1.73_m2} (ref >59)
GLUCOSE: 77 mg/dL (ref 65–99)
Globulin, Total: 2.3 g/dL (ref 1.5–4.5)
Potassium: 4 mmol/L (ref 3.5–5.2)
Sodium: 142 mmol/L (ref 134–144)
Total Protein: 7.2 g/dL (ref 6.0–8.5)

## 2018-04-01 LAB — PSA: Prostate Specific Ag, Serum: 1 ng/mL (ref 0.0–4.0)

## 2018-04-07 ENCOUNTER — Encounter: Payer: Self-pay | Admitting: Family Medicine

## 2018-04-11 NOTE — Telephone Encounter (Signed)
Replied by Mychart.

## 2018-04-13 ENCOUNTER — Encounter: Payer: PPO | Admitting: Family Medicine

## 2018-04-19 DIAGNOSIS — L570 Actinic keratosis: Secondary | ICD-10-CM | POA: Diagnosis not present

## 2018-04-19 DIAGNOSIS — Z85828 Personal history of other malignant neoplasm of skin: Secondary | ICD-10-CM | POA: Diagnosis not present

## 2018-04-19 DIAGNOSIS — D485 Neoplasm of uncertain behavior of skin: Secondary | ICD-10-CM | POA: Diagnosis not present

## 2018-04-19 DIAGNOSIS — L814 Other melanin hyperpigmentation: Secondary | ICD-10-CM | POA: Diagnosis not present

## 2018-04-19 DIAGNOSIS — L821 Other seborrheic keratosis: Secondary | ICD-10-CM | POA: Diagnosis not present

## 2018-04-26 DIAGNOSIS — K4090 Unilateral inguinal hernia, without obstruction or gangrene, not specified as recurrent: Secondary | ICD-10-CM | POA: Diagnosis not present

## 2018-08-25 DIAGNOSIS — K4090 Unilateral inguinal hernia, without obstruction or gangrene, not specified as recurrent: Secondary | ICD-10-CM | POA: Diagnosis not present

## 2018-09-08 ENCOUNTER — Other Ambulatory Visit: Payer: Self-pay | Admitting: General Surgery

## 2018-09-10 ENCOUNTER — Encounter: Payer: Self-pay | Admitting: Family Medicine

## 2018-09-12 NOTE — H&P (Signed)
  Christopher Hendricks Location: [facility] Patient #: [id number] DOB: 11-Apr-1946 Married / Language: English / Race: White Male       History of Present Illness  This is a pleasant 73 year old man with a right inguinal hernia. He saw Dr. Excell Seltzer on April 26, 2018. Dr. Excell Seltzer had scheduled him for laparoscopic repair of his right inguinal hernia on April 27. The surgery has been rescheduled with me while Dr. Excell Seltzer is out on medical leave. He is here for a preop check.  He's had the hernia for about a year. This getting bigger. Pain is 3-4 out of 10. Never incarcerated. Referred by Dr. Merri Ray.  Left inguinal hernia repair as a child, perhaps age 53 to 20. Vasectomy. BMI 21  My examination was the same as Dr. Excell Seltzer. Indirect right inguinal hernia. Reducible. I discussed the indications, techniques, and risks of surgery with him once again. All of his questions are answered. We are ready to go ahead   Allergies  Codeine Phosphate *ANALGESICS - OPIOID*  Sulfa Antibiotics  Allergies Reconciled   Medication History Omeprazole (20MG  Capsule DR, Oral) Active. Medications Reconciled  Vitals  Weight: 151.13 lb Height: 71in Body Surface Area: 1.87 m Body Mass Index: 21.08 kg/m  Temp.: 98.52F(Oral)  Pulse: 75 (Regular)  BP: 124/82 (Sitting, Left Arm, Standard)    Physical Exam  General Note: Very pleasant gentleman. Low BMI. Appears fit for his age. Mental status normal  Neck:  No mass. Supple. Trachea midline Lungs :  Clear bilaterally CV:  RRR, no ectopy or murmer, peripheral pulses intact. Abd:  Soft, no mass, no organomegaly, non tender  Male Genitourinary Note: Examined supine and standing. Medium-sized right inguinal hernia that does extend into the scrotum but is completely reducible. Transverse scar left groin from previous hernia repair. No recurrence. Umbilicus feels normal     Assessment &  Plan INGUINAL HERNIA, RIGHT (K40.90)  My examination today reveals a right inguinal hernia, pretty much exactly as Dr. Excell Seltzer described. This is most likely an indirect type of hernia because it extends into the scrotum It is completely reducible  I have scheduled to perform laparoscopic repair of your right inguinal hernia with mesh on June 5 We discussed surgical technique and its risks once again   [facility]. Dalbert Batman, M.D., [facility] General and Minimally invasive Surgery Breast and Colorectal Surgery Office:   [phone] Pager:   [phone]

## 2018-09-13 ENCOUNTER — Encounter (HOSPITAL_BASED_OUTPATIENT_CLINIC_OR_DEPARTMENT_OTHER): Payer: Self-pay | Admitting: *Deleted

## 2018-09-13 ENCOUNTER — Other Ambulatory Visit: Payer: Self-pay

## 2018-09-14 ENCOUNTER — Encounter (HOSPITAL_BASED_OUTPATIENT_CLINIC_OR_DEPARTMENT_OTHER)
Admission: RE | Admit: 2018-09-14 | Discharge: 2018-09-14 | Disposition: A | Discharge status: Home / Self Care | Payer: PPO | Source: Ambulatory Visit | Attending: General Surgery | Admitting: General Surgery

## 2018-09-14 ENCOUNTER — Other Ambulatory Visit (HOSPITAL_COMMUNITY)
Admission: RE | Admit: 2018-09-14 | Discharge: 2018-09-14 | Disposition: A | Discharge status: Home / Self Care | Payer: PPO | Source: Ambulatory Visit | Attending: General Surgery | Admitting: General Surgery

## 2018-09-14 DIAGNOSIS — Z1159 Encounter for screening for other viral diseases: Secondary | ICD-10-CM | POA: Diagnosis not present

## 2018-09-14 DIAGNOSIS — G709 Myoneural disorder, unspecified: Secondary | ICD-10-CM | POA: Diagnosis not present

## 2018-09-14 DIAGNOSIS — Z885 Allergy status to narcotic agent status: Secondary | ICD-10-CM | POA: Diagnosis not present

## 2018-09-14 DIAGNOSIS — K4090 Unilateral inguinal hernia, without obstruction or gangrene, not specified as recurrent: Secondary | ICD-10-CM | POA: Diagnosis not present

## 2018-09-14 DIAGNOSIS — Z886 Allergy status to analgesic agent status: Secondary | ICD-10-CM | POA: Diagnosis not present

## 2018-09-14 DIAGNOSIS — Z79899 Other long term (current) drug therapy: Secondary | ICD-10-CM | POA: Diagnosis not present

## 2018-09-14 DIAGNOSIS — K279 Peptic ulcer, site unspecified, unspecified as acute or chronic, without hemorrhage or perforation: Secondary | ICD-10-CM | POA: Diagnosis not present

## 2018-09-14 DIAGNOSIS — M1990 Unspecified osteoarthritis, unspecified site: Secondary | ICD-10-CM | POA: Diagnosis not present

## 2018-09-14 DIAGNOSIS — Z882 Allergy status to sulfonamides status: Secondary | ICD-10-CM | POA: Diagnosis not present

## 2018-09-14 LAB — CBC WITH DIFFERENTIAL/PLATELET
Abs Immature Granulocytes: 0 10*3/uL (ref 0.00–0.07)
Basophils Absolute: 0 10*3/uL (ref 0.0–0.1)
Basophils Relative: 1 %
Eosinophils Absolute: 0.1 10*3/uL (ref 0.0–0.5)
Eosinophils Relative: 3 %
HCT: 43.8 % (ref 39.0–52.0)
Hemoglobin: 14.4 g/dL (ref 13.0–17.0)
Immature Granulocytes: 0 %
Lymphocytes Relative: 19 %
Lymphs Abs: 0.8 10*3/uL (ref 0.7–4.0)
MCH: 31.9 pg (ref 26.0–34.0)
MCHC: 32.9 g/dL (ref 30.0–36.0)
MCV: 97.1 fL (ref 80.0–100.0)
Monocytes Absolute: 0.7 10*3/uL (ref 0.1–1.0)
Monocytes Relative: 16 %
Neutro Abs: 2.6 10*3/uL (ref 1.7–7.7)
Neutrophils Relative %: 61 %
Platelets: 177 10*3/uL (ref 150–400)
RBC: 4.51 MIL/uL (ref 4.22–5.81)
RDW: 12.3 % (ref 11.5–15.5)
WBC: 4.3 10*3/uL (ref 4.0–10.5)
nRBC: 0 % (ref 0.0–0.2)

## 2018-09-14 LAB — COMPREHENSIVE METABOLIC PANEL
ALT: 30 U/L (ref 0–44)
AST: 27 U/L (ref 15–41)
Albumin: 4.2 g/dL (ref 3.5–5.0)
Alkaline Phosphatase: 76 U/L (ref 38–126)
Anion gap: 7 (ref 5–15)
BUN: 11 mg/dL (ref 8–23)
CO2: 29 mmol/L (ref 22–32)
Calcium: 9.4 mg/dL (ref 8.9–10.3)
Chloride: 103 mmol/L (ref 98–111)
Creatinine, Ser: 0.92 mg/dL (ref 0.61–1.24)
GFR calc Af Amer: >60 mL/min (ref >60)
GFR calc non Af Amer: >60 mL/min (ref >60)
Glucose, Bld: 91 mg/dL (ref 70–99)
Potassium: 5.1 mmol/L (ref 3.5–5.1)
Sodium: 139 mmol/L (ref 135–145)
Total Bilirubin: 1.5 mg/dL — ABNORMAL HIGH (ref 0.3–1.2)
Total Protein: 6.8 g/dL (ref 6.5–8.1)

## 2018-09-14 NOTE — Progress Notes (Signed)
Ensure pre surgery drink given with instructions to complete by [place], pt verbalized understanding.

## 2018-09-15 LAB — NOVEL CORONAVIRUS, NAA (HOSP ORDER, SEND-OUT TO REF LAB; TAT 18-24 HRS): SARS-CoV-2, NAA: NOT DETECTED

## 2018-09-17 ENCOUNTER — Ambulatory Visit (HOSPITAL_BASED_OUTPATIENT_CLINIC_OR_DEPARTMENT_OTHER): Payer: PPO | Admitting: Certified Registered Nurse Anesthetist

## 2018-09-17 ENCOUNTER — Encounter (HOSPITAL_BASED_OUTPATIENT_CLINIC_OR_DEPARTMENT_OTHER): Payer: Self-pay

## 2018-09-17 ENCOUNTER — Ambulatory Visit (HOSPITAL_COMMUNITY)
Admission: RE | Admit: 2018-09-17 | Discharge: 2018-09-17 | Disposition: A | Discharge status: Home / Self Care | Payer: PPO | Attending: General Surgery | Admitting: General Surgery

## 2018-09-17 ENCOUNTER — Other Ambulatory Visit: Payer: Self-pay

## 2018-09-17 ENCOUNTER — Encounter (HOSPITAL_BASED_OUTPATIENT_CLINIC_OR_DEPARTMENT_OTHER)
Admission: RE | Disposition: A | Discharge status: Home / Self Care | Payer: Self-pay | Source: Home / Self Care | Attending: General Surgery

## 2018-09-17 DIAGNOSIS — Z885 Allergy status to narcotic agent status: Secondary | ICD-10-CM | POA: Diagnosis not present

## 2018-09-17 DIAGNOSIS — K279 Peptic ulcer, site unspecified, unspecified as acute or chronic, without hemorrhage or perforation: Secondary | ICD-10-CM | POA: Insufficient documentation

## 2018-09-17 DIAGNOSIS — Z886 Allergy status to analgesic agent status: Secondary | ICD-10-CM | POA: Diagnosis not present

## 2018-09-17 DIAGNOSIS — Z79899 Other long term (current) drug therapy: Secondary | ICD-10-CM | POA: Insufficient documentation

## 2018-09-17 DIAGNOSIS — M1990 Unspecified osteoarthritis, unspecified site: Secondary | ICD-10-CM | POA: Diagnosis not present

## 2018-09-17 DIAGNOSIS — Z882 Allergy status to sulfonamides status: Secondary | ICD-10-CM | POA: Diagnosis not present

## 2018-09-17 DIAGNOSIS — K4090 Unilateral inguinal hernia, without obstruction or gangrene, not specified as recurrent: Secondary | ICD-10-CM | POA: Diagnosis not present

## 2018-09-17 DIAGNOSIS — G709 Myoneural disorder, unspecified: Secondary | ICD-10-CM | POA: Insufficient documentation

## 2018-09-17 DIAGNOSIS — D62 Acute posthemorrhagic anemia: Secondary | ICD-10-CM | POA: Diagnosis not present

## 2018-09-17 DIAGNOSIS — K264 Chronic or unspecified duodenal ulcer with hemorrhage: Secondary | ICD-10-CM | POA: Diagnosis not present

## 2018-09-17 HISTORY — DX: Unilateral inguinal hernia, without obstruction or gangrene, not specified as recurrent: K40.90

## 2018-09-17 HISTORY — PX: INGUINAL HERNIA REPAIR: SHX194

## 2018-09-17 SURGERY — REPAIR, HERNIA, INGUINAL, LAPAROSCOPIC
Anesthesia: General | Site: Abdomen | Laterality: Right

## 2018-09-17 MED ORDER — CEFAZOLIN SODIUM-DEXTROSE 2-4 GM/100ML-% IV SOLN
2.0000 g | INTRAVENOUS | Status: AC
  Administered 2018-09-17: 2 g via INTRAVENOUS

## 2018-09-17 MED ORDER — CEFAZOLIN SODIUM-DEXTROSE 2-4 GM/100ML-% IV SOLN
INTRAVENOUS | Status: AC
  Filled 2018-09-17: qty 100

## 2018-09-17 MED ORDER — ROCURONIUM BROMIDE 10 MG/ML (PF) SYRINGE
PREFILLED_SYRINGE | INTRAVENOUS | Status: AC
  Filled 2018-09-17: qty 10

## 2018-09-17 MED ORDER — ACETAMINOPHEN 160 MG/5ML PO SOLN: 1000.0000 mg | Freq: Once | ORAL | Status: DC | PRN

## 2018-09-17 MED ORDER — MIDAZOLAM HCL 2 MG/2ML IJ SOLN: 1.0000 mg | INTRAMUSCULAR | Status: DC | PRN

## 2018-09-17 MED ORDER — LIDOCAINE 2% (20 MG/ML) 5 ML SYRINGE
INTRAMUSCULAR | Status: AC
  Filled 2018-09-17: qty 5

## 2018-09-17 MED ORDER — DEXAMETHASONE SODIUM PHOSPHATE 10 MG/ML IJ SOLN
INTRAMUSCULAR | Status: DC | PRN
  Administered 2018-09-17: 5 mg via INTRAVENOUS

## 2018-09-17 MED ORDER — ROCURONIUM BROMIDE 10 MG/ML (PF) SYRINGE
PREFILLED_SYRINGE | INTRAVENOUS | Status: DC | PRN
  Administered 2018-09-17: 30 mg via INTRAVENOUS
  Administered 2018-09-17: 20 mg via INTRAVENOUS

## 2018-09-17 MED ORDER — FENTANYL CITRATE (PF) 100 MCG/2ML IJ SOLN
50.0000 ug | INTRAMUSCULAR | Status: AC | PRN
  Administered 2018-09-17: 100 ug via INTRAVENOUS
  Administered 2018-09-17: 50 ug via INTRAVENOUS
  Administered 2018-09-17: 25 ug via INTRAVENOUS

## 2018-09-17 MED ORDER — SCOPOLAMINE 1 MG/3DAYS TD PT72: 1.0000 | MEDICATED_PATCH | Freq: Once | TRANSDERMAL | Status: DC | PRN

## 2018-09-17 MED ORDER — HYDRALAZINE HCL 20 MG/ML IJ SOLN
INTRAMUSCULAR | Status: DC | PRN
  Administered 2018-09-17: 10 mg via INTRAVENOUS

## 2018-09-17 MED ORDER — OXYCODONE HCL 5 MG PO TABS
ORAL_TABLET | ORAL | Status: AC
  Filled 2018-09-17: qty 1

## 2018-09-17 MED ORDER — FENTANYL CITRATE (PF) 100 MCG/2ML IJ SOLN
INTRAMUSCULAR | Status: AC
  Filled 2018-09-17: qty 2

## 2018-09-17 MED ORDER — PROPOFOL 10 MG/ML IV BOLUS
INTRAVENOUS | Status: AC
  Filled 2018-09-17: qty 20

## 2018-09-17 MED ORDER — LACTATED RINGERS IV SOLN
INTRAVENOUS | Status: DC
  Administered 2018-09-17 (×2): via INTRAVENOUS

## 2018-09-17 MED ORDER — GABAPENTIN 300 MG PO CAPS
300.0000 mg | ORAL_CAPSULE | ORAL | Status: AC
  Administered 2018-09-17: 300 mg via ORAL

## 2018-09-17 MED ORDER — FENTANYL CITRATE (PF) 100 MCG/2ML IJ SOLN
25.0000 ug | INTRAMUSCULAR | Status: DC | PRN
  Administered 2018-09-17 (×2): 25 ug via INTRAVENOUS

## 2018-09-17 MED ORDER — ACETAMINOPHEN 10 MG/ML IV SOLN: 1000.0000 mg | Freq: Once | INTRAVENOUS | Status: DC | PRN

## 2018-09-17 MED ORDER — PROPOFOL 10 MG/ML IV BOLUS
INTRAVENOUS | Status: DC | PRN
  Administered 2018-09-17: 180 mg via INTRAVENOUS

## 2018-09-17 MED ORDER — OXYCODONE HCL 5 MG PO TABS
5.0000 mg | ORAL_TABLET | Freq: Once | ORAL | Status: AC | PRN
  Administered 2018-09-17: 5 mg via ORAL

## 2018-09-17 MED ORDER — SODIUM CHLORIDE (PF) 0.9 % IJ SOLN: INTRAMUSCULAR | Status: DC | PRN

## 2018-09-17 MED ORDER — ACETAMINOPHEN 500 MG PO TABS
1000.0000 mg | ORAL_TABLET | ORAL | Status: AC
  Administered 2018-09-17: 1000 mg via ORAL

## 2018-09-17 MED ORDER — GABAPENTIN 300 MG PO CAPS
ORAL_CAPSULE | ORAL | Status: AC
  Filled 2018-09-17: qty 1

## 2018-09-17 MED ORDER — ACETAMINOPHEN 500 MG PO TABS: 1000.0000 mg | ORAL_TABLET | Freq: Once | ORAL | Status: DC | PRN

## 2018-09-17 MED ORDER — CHLORHEXIDINE GLUCONATE CLOTH 2 % EX PADS: 6.0000 | MEDICATED_PAD | Freq: Once | CUTANEOUS | Status: DC

## 2018-09-17 MED ORDER — OXYCODONE HCL 5 MG/5ML PO SOLN: 5.0000 mg | Freq: Once | ORAL | Status: AC | PRN

## 2018-09-17 MED ORDER — SODIUM CHLORIDE 0.9% FLUSH: 3.0000 mL | Freq: Two times a day (BID) | INTRAVENOUS | Status: DC

## 2018-09-17 MED ORDER — BUPIVACAINE-EPINEPHRINE (PF) 0.5% -1:200000 IJ SOLN
INTRAMUSCULAR | Status: DC | PRN
  Administered 2018-09-17: 10 mL via PERINEURAL

## 2018-09-17 MED ORDER — HYDROCODONE-ACETAMINOPHEN 5-325 MG PO TABS: 1.0000 | ORAL_TABLET | ORAL | 0 refills | Status: DC | PRN

## 2018-09-17 MED ORDER — MIDAZOLAM HCL 2 MG/2ML IJ SOLN
INTRAMUSCULAR | Status: AC
  Filled 2018-09-17: qty 2

## 2018-09-17 MED ORDER — SUGAMMADEX SODIUM 200 MG/2ML IV SOLN
INTRAVENOUS | Status: AC
  Filled 2018-09-17: qty 2

## 2018-09-17 MED ORDER — ACETAMINOPHEN 500 MG PO TABS
ORAL_TABLET | ORAL | Status: AC
  Filled 2018-09-17: qty 2

## 2018-09-17 MED ORDER — LIDOCAINE HCL (CARDIAC) PF 100 MG/5ML IV SOSY
PREFILLED_SYRINGE | INTRAVENOUS | Status: DC | PRN
  Administered 2018-09-17: 60 mg via INTRAVENOUS

## 2018-09-17 MED ORDER — EPHEDRINE SULFATE-NACL 50-0.9 MG/10ML-% IV SOSY
PREFILLED_SYRINGE | INTRAVENOUS | Status: DC | PRN
  Administered 2018-09-17 (×2): 5 mg via INTRAVENOUS

## 2018-09-17 MED ORDER — SUCCINYLCHOLINE CHLORIDE 200 MG/10ML IV SOSY
PREFILLED_SYRINGE | INTRAVENOUS | Status: DC | PRN
  Administered 2018-09-17: 80 mg via INTRAVENOUS

## 2018-09-17 MED ORDER — DEXAMETHASONE SODIUM PHOSPHATE 10 MG/ML IJ SOLN
INTRAMUSCULAR | Status: AC
  Filled 2018-09-17: qty 1

## 2018-09-17 MED ORDER — ONDANSETRON HCL 4 MG/2ML IJ SOLN
INTRAMUSCULAR | Status: AC
  Filled 2018-09-17: qty 2

## 2018-09-17 MED ORDER — SUCCINYLCHOLINE CHLORIDE 200 MG/10ML IV SOSY
PREFILLED_SYRINGE | INTRAVENOUS | Status: AC
  Filled 2018-09-17: qty 10

## 2018-09-17 MED ORDER — SUGAMMADEX SODIUM 200 MG/2ML IV SOLN
INTRAVENOUS | Status: DC | PRN
  Administered 2018-09-17: 140 mg via INTRAVENOUS

## 2018-09-17 MED ORDER — EPHEDRINE 5 MG/ML INJ
INTRAVENOUS | Status: AC
  Filled 2018-09-17: qty 10

## 2018-09-17 MED ORDER — ONDANSETRON HCL 4 MG/2ML IJ SOLN
INTRAMUSCULAR | Status: DC | PRN
  Administered 2018-09-17: 4 mg via INTRAVENOUS

## 2018-09-17 SURGICAL SUPPLY — 43 items
APPLICATOR ARISTA FLEXITIP XL (MISCELLANEOUS) IMPLANT
APPLIER CLIP 5 13 M/L LIGAMAX5 (MISCELLANEOUS)
APPLIER CLIP LOGIC TI 5 (MISCELLANEOUS) IMPLANT
BLADE CLIPPER SURG (BLADE) IMPLANT
CHLORAPREP W/TINT 26 (MISCELLANEOUS) ×3 IMPLANT
CLIP APPLIE 5 13 M/L LIGAMAX5 (MISCELLANEOUS) IMPLANT
COVER WAND RF STERILE (DRAPES) IMPLANT
DERMABOND ADVANCED (GAUZE/BANDAGES/DRESSINGS) ×2
DERMABOND ADVANCED .7 DNX12 (GAUZE/BANDAGES/DRESSINGS) ×1 IMPLANT
DEVICE SECURE STRAP 25 ABSORB (INSTRUMENTS) ×3 IMPLANT
DISSECT BALLN SPACEMKR + OVL (BALLOONS) ×3
DISSECTOR BALLN SPACEMKR + OVL (BALLOONS) ×1 IMPLANT
DISSECTOR BLUNT TIP ENDO 5MM (MISCELLANEOUS) IMPLANT
ELECT REM PT RETURN 9FT ADLT (ELECTROSURGICAL) ×3
ELECTRODE REM PT RTRN 9FT ADLT (ELECTROSURGICAL) ×1 IMPLANT
GLOVE EUDERMIC 7 POWDERFREE (GLOVE) ×3 IMPLANT
GOWN STRL REUS W/ TWL LRG LVL3 (GOWN DISPOSABLE) ×1 IMPLANT
GOWN STRL REUS W/TWL LRG LVL3 (GOWN DISPOSABLE) ×2
GOWN STRL REUS W/TWL XL LVL3 (GOWN DISPOSABLE) ×3 IMPLANT
IV SET EXT 30 76VOL 4 MALE LL (IV SETS) ×3 IMPLANT
MESH 3DMAX 4X6 RT LRG (Mesh General) ×2 IMPLANT
NDL INSUFFLATION 14GA 120MM (NEEDLE) IMPLANT
NEEDLE INSUFFLATION 14GA 120MM (NEEDLE) IMPLANT
NS IRRIG 1000ML POUR BTL (IV SOLUTION) IMPLANT
PACK BASIN DAY SURGERY FS (CUSTOM PROCEDURE TRAY) ×3 IMPLANT
PENCIL BUTTON HOLSTER BLD 10FT (ELECTRODE) ×3 IMPLANT
SCISSORS LAP 5X35 DISP (ENDOMECHANICALS) ×3 IMPLANT
SET IRRIG TUBING LAPAROSCOPIC (IRRIGATION / IRRIGATOR) IMPLANT
SET TROCAR LAP APPLE-HUNT 5MM (ENDOMECHANICALS) ×3 IMPLANT
SET TUBE SMOKE EVAC HIGH FLOW (TUBING) ×3 IMPLANT
SLEEVE ENDOPATH XCEL 5M (ENDOMECHANICALS) ×8 IMPLANT
SLEEVE SCD COMPRESS KNEE MED (MISCELLANEOUS) ×3 IMPLANT
SUT MNCRL AB 4-0 PS2 18 (SUTURE) ×3 IMPLANT
TOWEL GREEN STERILE FF (TOWEL DISPOSABLE) ×3 IMPLANT
TRAY FOL W/BAG SLVR 16FR STRL (SET/KITS/TRAYS/PACK) IMPLANT
TRAY FOLEY W/BAG SLVR 14FR LF (SET/KITS/TRAYS/PACK) IMPLANT
TRAY FOLEY W/BAG SLVR 16FR LF (SET/KITS/TRAYS/PACK)
TRAY LAPAROSCOPIC (CUSTOM PROCEDURE TRAY) ×3 IMPLANT
TROCAR ADV FIXATION 5X100MM (TROCAR) IMPLANT
TROCAR BALLN 12MMX100 BLUNT (TROCAR) IMPLANT
TROCAR XCEL NON-BLD 5MMX100MML (ENDOMECHANICALS) ×3 IMPLANT
TUBE CONNECTING 20'X1/4 (TUBING) ×1
TUBE CONNECTING 20X1/4 (TUBING) ×1 IMPLANT

## 2018-09-17 NOTE — Anesthesia Procedure Notes (Signed)
Procedure Name: Intubation Date/Time: 09/17/2018 10:59 AM Performed by: Raenette Rover, CRNA Pre-anesthesia Checklist: Patient identified, Emergency Drugs available, Suction available and Patient being monitored Patient Re-evaluated:Patient Re-evaluated prior to induction Oxygen Delivery Method: Circle system utilized Preoxygenation: Pre-oxygenation with 100% oxygen Induction Type: IV induction and Rapid sequence Laryngoscope Size: Miller and 3 Grade View: Grade I Tube type: Oral Tube size: 8.0 mm Number of attempts: 1 Airway Equipment and Method: Stylet Placement Confirmation: ETT inserted through vocal cords under direct vision,  positive ETCO2,  CO2 detector and breath sounds checked- equal and bilateral Secured at: 23 cm Tube secured with: Tape Dental Injury: Teeth and Oropharynx as per pre-operative assessment

## 2018-09-17 NOTE — Transfer of Care (Signed)
Immediate Anesthesia Transfer of Care Note  Patient: Christopher Hendricks  Procedure(s) Performed: LAPAROSCOPIC REPAIR RIGHT INGUINAL HERNIA (Right Abdomen)  Patient Location: PACU  Anesthesia Type:General  Level of Consciousness: awake, alert , oriented and patient cooperative  Airway & Oxygen Therapy: Patient Spontanous Breathing and Patient connected to nasal cannula oxygen  Post-op Assessment: Report given to RN and Post -op Vital signs reviewed and stable  Post vital signs: Reviewed and stable  Last Vitals:  Vitals Value Taken Time  BP 142/82 09/17/2018 12:35 PM  Temp    Pulse 69 09/17/2018 12:36 PM  Resp 18 09/17/2018 12:36 PM  SpO2 98 % 09/17/2018 12:36 PM  Vitals shown include unvalidated device data.  Last Pain:  Vitals:   09/17/18 1008  TempSrc: Oral  PainSc: 0-No pain         Complications: No apparent anesthesia complications

## 2018-09-17 NOTE — Interval H&P Note (Signed)
History and Physical Interval Note:  09/17/2018 10:19 AM  Christopher Hendricks  has presented today for surgery, with the diagnosis of RIGHT INGUINAL HERNIA.  The various methods of treatment have been discussed with the patient and family. After consideration of risks, benefits and other options for treatment, the patient has consented to  Procedure(s): [place] (Right) as a surgical intervention.  The patient's history has been reviewed, patient examined, no change in status, stable for surgery.  I have reviewed the patient's chart and labs.  Questions were answered to the patient's satisfaction.     Adin Hector

## 2018-09-17 NOTE — Anesthesia Preprocedure Evaluation (Signed)
Anesthesia Evaluation  Patient identified by MRN, date of birth, ID band Patient awake    Reviewed: Allergy & Precautions, NPO status , Patient's Chart, lab work & pertinent test results  History of Anesthesia Complications Negative for: history of anesthetic complications  Airway Mallampati: II  TM Distance: >3 FB Neck ROM: Full    Dental  (+) Teeth Intact   Pulmonary neg pulmonary ROS,    breath sounds clear to auscultation       Cardiovascular  Rhythm:Regular     Neuro/Psych PSYCHIATRIC DISORDERS  Neuromuscular disease    GI/Hepatic PUD, (+)     substance abuse  alcohol use,   Endo/Other  negative endocrine ROS  Renal/GU negative Renal ROS     Musculoskeletal  (+) Arthritis ,   Abdominal   Peds  Hematology negative hematology ROS (+)   Anesthesia Other Findings   Reproductive/Obstetrics                             Anesthesia Physical Anesthesia Plan  ASA: II  Anesthesia Plan: General   Post-op Pain Management:    Induction: Intravenous  PONV Risk Score and Plan: 2 and Ondansetron and Dexamethasone  Airway Management Planned: Oral ETT and LMA  Additional Equipment: None  Intra-op Plan:   Post-operative Plan: Extubation in OR  Informed Consent: I have reviewed the patients History and Physical, chart, labs and discussed the procedure including the risks, benefits and alternatives for the proposed anesthesia with the patient or authorized representative who has indicated his/her understanding and acceptance.     Dental advisory given  Plan Discussed with: CRNA and Surgeon  Anesthesia Plan Comments:         Anesthesia Quick Evaluation

## 2018-09-17 NOTE — Anesthesia Postprocedure Evaluation (Signed)
Anesthesia Post Note  Patient: Christopher Hendricks  Procedure(s) Performed: LAPAROSCOPIC REPAIR RIGHT INGUINAL HERNIA (Right Abdomen)     Patient location during evaluation: PACU Anesthesia Type: General Level of consciousness: awake and alert Pain management: pain level controlled Vital Signs Assessment: post-procedure vital signs reviewed and stable Respiratory status: spontaneous breathing, nonlabored ventilation, respiratory function stable and patient connected to nasal cannula oxygen Cardiovascular status: blood pressure returned to baseline and stable Postop Assessment: no apparent nausea or vomiting Anesthetic complications: no    Last Vitals:  Vitals:   09/17/18 1330 09/17/18 1415  BP: 130/76 (!) 143/78  Pulse: 71 63  Resp: 12 16  Temp:  36.6 C  SpO2: 99% 100%    Last Pain:  Vitals:   09/17/18 1430  TempSrc:   PainSc: 3                  Kaled Allende

## 2018-09-17 NOTE — Discharge Instructions (Signed)
[facility]  UMBILICAL OR INGUINAL HERNIA REPAIR: POST OP INSTRUCTIONS  Always review your discharge instruction sheet given to you by the facility where your surgery was performed. IF YOU HAVE DISABILITY OR FAMILY LEAVE FORMS, YOU MUST BRING THEM TO THE OFFICE FOR PROCESSING.   DO NOT GIVE THEM TO YOUR DOCTOR.  1. A  prescription for pain medication may be given to you upon discharge.  Take your pain medication as prescribed, if needed.  If narcotic pain medicine is not needed, then you may take acetaminophen (Tylenol) or ibuprofen (Advil) as needed. 2. Take your usually prescribed medications unless otherwise directed. If you need a refill on your pain medication, please contact your pharmacy.  They will contact our office to request authorization. Prescriptions will not be filled after 5 pm or on week-ends. 3. You should follow a light diet the first 24 hours after arrival home, such as soup and crackers, etc.  Be sure to include lots of fluids daily.  Resume your normal diet the day after surgery. 4.Most patients will experience some swelling and bruising around the umbilicus or in the groin and scrotum.  Ice packs and reclining will help.  Swelling and bruising can take several days to resolve.  6. It is common to experience some constipation if taking pain medication after surgery.  Increasing fluid intake and taking a stool softener (such as Colace) will usually help or prevent this problem from occurring.  A mild laxative (Milk of Magnesia or Miralax) should be taken according to package directions if there are no bowel movements after 48 hours. 7. Unless discharge instructions indicate otherwise, you may remove your bandages 24-48 hours after surgery, and you may shower at that time.  You may have steri-strips (small skin tapes) in place directly over the incision.  These strips should be left on the skin for 7-10 days.  If your surgeon used skin glue on the incision, you  may shower in 24 hours.  The glue will flake off over the next 2-3 weeks.  Any sutures or staples will be removed at the office during your follow-up visit. 8. ACTIVITIES:  You may resume regular (light) daily activities beginning the next day--such as daily self-care, walking, climbing stairs--gradually increasing activities as tolerated.  You may have sexual intercourse when it is comfortable.  Refrain from any heavy lifting or straining until approved by your doctor.  a.You may drive when you are no longer taking prescription pain medication, you can comfortably wear a seatbelt, and you can safely maneuver your car and apply brakes. b.RETURN TO WORK:   _____________________________________________  9.You should see your doctor in the office for a follow-up appointment approximately 2-3 weeks after your surgery.  Make sure that you call for this appointment within a day or two after you arrive home to insure a convenient appointment time. 10.OTHER INSTRUCTIONS: _________________________    _____________________________________  WHEN TO CALL YOUR DOCTOR: 1. Fever over 101.0 2. Inability to urinate 3. Nausea and/or vomiting 4. Extreme swelling or bruising 5. Continued bleeding from incision. 6. Increased pain, redness, or drainage from the incision  The clinic staff is available to answer your questions during regular business hours.  Please dont hesitate to call and ask to speak to one of the nurses for clinical concerns.  If you have a medical emergency, go to the nearest emergency room or call 911.  A surgeon from [facility] is always on call at the hospital  [address] [place], [place], [place]  [address] ?  [address], [place],    [address] [phone] ? [phone] ? FAX [phone] Web site: [email or web address]            Managing Your Pain After Surgery Without Opioids    Thank you for participating in our  program to help patients manage their pain after surgery without opioids. This is part of our effort to provide you with the best care possible, without exposing you or your family to the risk that opioids pose.  What pain can I expect after surgery? You can expect to have some pain after surgery. This is normal. The pain is typically worse the day after surgery, and quickly begins to get better. Many studies have found that many patients are able to manage their pain after surgery with Over-the-Counter (OTC) medications such as Tylenol and Motrin. If you have a condition that does not allow you to take Tylenol or Motrin, notify your surgical team.  How will I manage my pain? The best strategy for controlling your pain after surgery is around the clock pain control with Tylenol (acetaminophen) and Motrin (ibuprofen or Advil). Alternating these medications with each other allows you to maximize your pain control. In addition to Tylenol and Motrin, you can use heating pads or ice packs on your incisions to help reduce your pain.  How will I alternate your regular strength over-the-counter pain medication? You will take a dose of pain medication every three hours. ; Start by taking 650 mg of Tylenol (2 pills of 325 mg) ; 3 hours later take 600 mg of Motrin (3 pills of 200 mg) ; 3 hours after taking the Motrin take 650 mg of Tylenol ; 3 hours after that take 600 mg of Motrin.   - 1 -  See example - if your first dose of Tylenol is at 12:00 PM   12:00 PM Tylenol 650 mg (2 pills of 325 mg)  3:00 PM Motrin 600 mg (3 pills of 200 mg)  6:00 PM Tylenol 650 mg (2 pills of 325 mg)  9:00 PM Motrin 600 mg (3 pills of 200 mg)  Continue alternating every 3 hours   We recommend that you follow this schedule around-the-clock for at least 3 days after surgery, or until you feel that it is no longer needed. Use the table on the last page of this handout to keep track of the medications you are  taking. Important: Do not take more than 3000mg  of Tylenol or 3200mg  of Motrin in a 24-hour period. Do not take ibuprofen/Motrin if you have a history of bleeding stomach ulcers, severe kidney disease, &/or actively taking a blood thinner  What if I still have pain? If you have pain that is not controlled with the over-the-counter pain medications (Tylenol and Motrin or Advil) you might have what we call breakthrough pain. You will receive a prescription for a small amount of an opioid pain medication such as Oxycodone, Tramadol, or Tylenol with Codeine. Use these opioid pills in the first 24 hours after surgery if you have breakthrough pain. Do not take more than 1 pill every 4-6 hours.  If you still have uncontrolled pain after using all opioid pills, don't hesitate to call our staff using the number provided. We will help make sure you are managing your pain in the best way possible, and if necessary, we can provide a prescription for additional pain medication.   Day 1  Time  Name of Medication Number of pills taken  Amount of Acetaminophen  Pain Level   Comments  AM PM       AM PM       AM PM       AM PM       AM PM       AM PM       AM PM       AM PM       Total Daily amount of Acetaminophen Do not take more than  3,000 mg per day      Day 2    Time  Name of Medication Number of pills taken  Amount of Acetaminophen  Pain Level   Comments  AM PM       AM PM       AM PM       AM PM       AM PM       AM PM       AM PM       AM PM       Total Daily amount of Acetaminophen Do not take more than  3,000 mg per day      Day 3    Time  Name of Medication Number of pills taken  Amount of Acetaminophen  Pain Level   Comments  AM PM       AM PM       AM PM       AM PM          AM PM       AM PM       AM PM       AM PM       Total Daily amount of Acetaminophen Do not take more than  3,000 mg per day      Day 4    Time  Name of Medication  Number of pills taken  Amount of Acetaminophen  Pain Level   Comments  AM PM       AM PM       AM PM       AM PM       AM PM       AM PM       AM PM       AM PM       Total Daily amount of Acetaminophen Do not take more than  3,000 mg per day      Day 5    Time  Name of Medication Number of pills taken  Amount of Acetaminophen  Pain Level   Comments  AM PM       AM PM       AM PM       AM PM       AM PM       AM PM       AM PM       AM PM       Total Daily amount of Acetaminophen Do not take more than  3,000 mg per day       Day 6    Time  Name of Medication Number of pills taken  Amount of Acetaminophen  Pain Level  Comments  AM PM       AM PM       AM PM       AM PM  AM PM       AM PM       AM PM       AM PM       Total Daily amount of Acetaminophen Do not take more than  3,000 mg per day      Day 7    Time  Name of Medication Number of pills taken  Amount of Acetaminophen  Pain Level   Comments  AM PM       AM PM       AM PM       AM PM       AM PM       AM PM       AM PM       AM PM       Total Daily amount of Acetaminophen Do not take more than  3,000 mg per day        For additional information about how and where to safely dispose of unused opioid medications - [employer]  Disclaimer: This document contains information and/or instructional materials adapted from [place] for the typical patient with your condition. It does not replace medical advice from your health care provider because your experience may differ from that of the typical patient. Talk to your health care provider if you have any questions about this document, your condition or your treatment plan. Adapted from [place] Instructions  Activity: Get plenty of rest for the remainder of the day. A responsible individual must stay with you for 24 hours following the procedure.   For the next 24 hours, DO NOT: -Drive a car -Paediatric nurse -Drink alcoholic beverages -Take any medication unless instructed by your physician -Make any legal decisions or sign important papers.  Meals: Start with liquid foods such as gelatin or soup. Progress to regular foods as tolerated. Avoid greasy, spicy, heavy foods. If nausea and/or vomiting occur, drink only clear liquids until the nausea and/or vomiting subsides. Call your physician if vomiting continues.  Special Instructions/Symptoms: Your throat may feel dry or sore from the anesthesia or the breathing tube placed in your throat during surgery. If this causes discomfort, gargle with warm salt water. The discomfort should disappear within 24 hours.  If you had a scopolamine patch placed behind your ear for the management of post- operative nausea and/or vomiting:  1. The medication in the patch is effective for 72 hours, after which it should be removed.  Wrap patch in a tissue and discard in the trash. Wash hands thoroughly with soap and water. 2. You may remove the patch earlier than 72 hours if you experience unpleasant side effects which may include dry mouth, dizziness or visual disturbances. 3. Avoid touching the patch. Wash your hands with soap and water after contact with the patch.

## 2018-09-17 NOTE — Op Note (Signed)
Patient Name:           Christopher Hendricks   Date of Surgery:        09/17/2018  Pre op Diagnosis:      Right inguinal hernia  Post op Diagnosis:    Indirect right inguinal hernia  Procedure:                 Laparoscopic, preperitoneal repair of indirect right inguinal hernia with 3D max mesh  Surgeon:                     Edsel Petrin. Dalbert Batman, M.D., FACS  Assistant:                      Or staff  Operative Indications:   This is a pleasant 73 year old man with a right inguinal hernia. He saw Dr. Excell Seltzer on April 26, 2018. Dr. Excell Seltzer had scheduled him for laparoscopic repair of his right inguinal hernia on April 27. The surgery has been rescheduled with me while Dr. Excell Seltzer is out on medical leave. He is here for a preop check. He's had the hernia for about a year. This getting bigger. Pain is 3-4 out of 10. Never incarcerated. Referred by Dr. Merri Ray. Left inguinal hernia repair as a child, perhaps age 53 to 86. Vasectomy. BMI 21 My examination was the same as Dr. Excell Seltzer. Indirect right inguinal hernia. Reducible. I discussed the indications, techniques  Operative Findings:       He had a large indirect right inguinal hernia.  The hernia sac was very long and went all the way down the inguinal canal.  I pulled it back is much as I could but ultimately had to isolate and divide the indirect sac.  There was a little bit of weakness and bulge at the direct space but no true hernia.  There was a little bit of bleeding on the right side laterally which was controlled with cautery and clips.  Procedure in Detail:          Following the induction of general endotracheal anesthesia the patient's abdomen and genitalia were prepped and draped in a sterile fashion.  Surgical timeout was performed.  Intravenous antibiotics were given.  0.5% Marcaine with epinephrine was used as a local infiltration anesthetic.      A transverse incision was made at the lower umbilical rim.  The  anterior rectus sheath on the right side was incised transversely.  The right rectus muscle was retracted laterally.  A Spacemaker balloon was inserted into the right rectus sheath in the midline.  Video camera was inserted and the balloon was inflated under direct vision.  It deployed bilaterally.  It was held in place for about 3 minutes and then deflated and removed.  The trocar balloon was inflated and the trocar secured to the fascia and the trocar connected to insufflation at 13 mmHg.  Video camera was inserted.  Visualization was good.  A 5 mm trocar was placed in the midline below the umbilicus.  Through this trocar we cleaned off the pre-peritoneal areolar tissue off of the muscles.  Once we had the muscles on the left side cleaned off we placed a 5 mm trocar in the left lower quadrant.  We then cleaned off all of the areolar tissue on the right side exposing the lateral inguinal area.  We identified the symphysis pubis pubis and Cooper's ligaments.  We isolated the cord structures.  We spent  a long time dissecting the indirect sac but could not completely reduce it out of the inguinal canal so once we had a completely isolated and could visualize the vas deferens and testicular vessels we simply divided the sac, clipped it and pulled it back superiorly.  3D max mesh, large size, was inserted and positioned so as to overlap the midline a little bit, overlap Cooper's ligament a little bit.  The mesh deployed laterally very nicely.  The mesh was secured with the secure strap up the midline, along the superior rim of Cooper's ligament.  Laterally I placed a couple of secure strap firings but was sure to palpate through the abdominal wall to be sure that I stayed above the ileo-pubic tract.  The mesh appeared well positioned.  I pulled the indirect sac back superior to the mesh and tacked it to the abdominal wall so that it would not recur.  Operative field was irrigated.  There was no active bleeding.  The  pneumoperitoneum was released through the closed suction system.  The trochars were removed.  Fascia at the umbilicus was closed with 2 figure-of-eight sutures of 0 Vicryl.  The skin incisions were closed with subcuticular sutures of 4-0 Monocryl and Dermabond.  The patient tolerated the procedure well was taken to PACU in stable condition.  EBL 20 cc.  Counts correct.  Complications none.     Addendum: I logged onto the PMP aware website and reviewed his prescription medication history    Agostino Gorin M. Dalbert Batman, M.D., FACS General and Minimally Invasive Surgery Breast and Colorectal Surgery  09/17/2018 12:34 PM

## 2018-09-20 ENCOUNTER — Encounter: Payer: Self-pay | Admitting: Family Medicine

## 2018-09-20 ENCOUNTER — Encounter (HOSPITAL_BASED_OUTPATIENT_CLINIC_OR_DEPARTMENT_OTHER): Payer: Self-pay | Admitting: General Surgery

## 2018-09-23 ENCOUNTER — Encounter: Payer: Self-pay | Admitting: Family Medicine

## 2018-09-27 ENCOUNTER — Encounter: Payer: Self-pay | Admitting: Family Medicine

## 2018-09-29 ENCOUNTER — Telehealth: Payer: Self-pay | Admitting: Family Medicine

## 2018-09-29 ENCOUNTER — Other Ambulatory Visit: Payer: Self-pay

## 2018-09-29 ENCOUNTER — Ambulatory Visit (INDEPENDENT_AMBULATORY_CARE_PROVIDER_SITE_OTHER): Payer: PPO | Admitting: Family Medicine

## 2018-09-29 VITALS — BP 118/68 | HR 79 | Temp 98.3°F | Ht 71.5 in | Wt 155.0 lb

## 2018-09-29 DIAGNOSIS — E785 Hyperlipidemia, unspecified: Secondary | ICD-10-CM | POA: Diagnosis not present

## 2018-09-29 NOTE — Telephone Encounter (Signed)
09/29/2018 - Christopher Hendricks - PATIENT SAW DR. Carlota Raspberry ON Wednesday (09/29/2018) FOR HIS 6 MONTH FOLLOW-UP. DR. Carlota Raspberry HAS REQUESTED HE RETURN IN 6 MONTHS (DEC. 2020) FOR HIS MEDICARE WELLNESS EXAM WITH REPEAT LABS. BEST PHONE [phone] (HOME) [place]

## 2018-09-29 NOTE — Patient Instructions (Addendum)
   I will check cholesterol levels again today and then we can discuss potential need or options for statin depending on those results.  Bilirubin was borderline elevated, but stable from prior readings.  We can recheck that in the next 3 to 6 months.  Continue to try to minimize alcohol intake.  Thank you for coming in today, and let me know if there are questions.  If you have lab work done today you will be contacted with your lab results within the next 2 weeks.  If you have not heard from [place] then please contact us. The fastest way to get your results is to register for My Chart.   IF you received an x-ray today, you will receive an invoice from [facility]. Please contact [facility] at [phone] with questions or concerns regarding your invoice.   IF you received labwork today, you will receive an invoice from [facility]. Please contact [facility] at [phone] with questions or concerns regarding your invoice.   Our billing staff will not be able to assist you with questions regarding bills from these companies.  You will be contacted with the lab results as soon as they are available. The fastest way to get your results is to activate your My Chart account. Instructions are located on the last page of this paperwork. If you have not heard from [place] regarding the results in 2 weeks, please contact this office.

## 2018-09-29 NOTE — Progress Notes (Signed)
Subjective:    Patient ID: Christopher Hendricks, male    DOB: [birth year], 73 y.o.   MRN: [id number]  HPI Christopher Hendricks is a 73 y.o. male Presents today for: Chief Complaint  Patient presents with  . Hyperlipidemia    patient is here today for f/u for his cholesterol. Patient was last seen 03/31/18 for an wellness physical. Patient states he is doing fine with no issus at this time   Hyperlipidemia:  Lab Results  Component Value Date   CHOL 197 03/31/2018   HDL 66 03/31/2018   LDLCALC 111 (H) 03/31/2018   TRIG 99 03/31/2018   CHOLHDL 3.0 03/31/2018   Lab Results  Component Value Date   ALT 30 09/14/2018   AST 27 09/14/2018   ALKPHOS 76 09/14/2018   BILITOT 1.5 (H) 09/14/2018   The 10-year ASCVD risk score Mikey Bussing DC Jr., et al., 2013) is: 15%   Values used to calculate the score:     Age: 41 years     Sex: Male     Is Non-Hispanic African American: No     Diabetic: No     Tobacco smoker: No     Systolic Blood Pressure: 643 mmHg     Is BP treated: No     HDL Cholesterol: 66 mg/dL     Total Cholesterol: 197 mg/dL Option of statin was discussed in December 2019.  Planned diet/exercise changes.  Weight similar range now is in December. Has been trying to adhere to low cholesterol diet. Walking most days per week.   Wt Readings from Last 3 Encounters:  09/29/18 155 lb (70.3 kg)  09/17/18 147 lb (66.7 kg)  03/31/18 155 lb (70.3 kg)   Recent lap R hernia repair on 6/5. Doing ok, back to walking.  Pain managed with tylenol.   Alcohol: drinking less - buying less. Now at 3 glasses down from 4-5 per night.  Stressors but has been managing as well.  Wife had a fall recently, multiple falls in the past.  Her general practitioner has recommended a walker as well as [name] but she declines.Marland Kitchen He feels like he is handling the stressors well at this time and denies any depression or need for assistance.   Depression screen [facility] 2/9 09/29/2018 09/29/2018 03/31/2018 12/15/2017  12/15/2017  Decreased Interest 0 0 0 0 0  Down, Depressed, Hopeless 0 0 0 0 0  PHQ - 2 Score 0 0 0 0 0      Patient Active Problem List   Diagnosis Date Noted  . Right inguinal hernia 09/17/2018  . Duodenal ulcer with hemorrhage 08/18/2012  . Acute posthemorrhagic anemia 08/18/2012  . GI bleed 08/17/2012  . Anemia 08/17/2012  . Alcohol abuse 08/17/2012   Past Medical History:  Diagnosis Date  . Alcoholism /alcohol abuse ([place])   . Allergy   . Arthritis   . Blood transfusion without reported diagnosis   . Duodenal ulcer   . Heart murmur    as teen  . Neuromuscular disorder ([place])    pinched nerve in neck that causes numbness in fingers on right hand   . Right inguinal hernia 09/17/2018  . Sigmoid diverticulosis 2007   Past Surgical History:  Procedure Laterality Date  . COLONOSCOPY  2007   sigmoid diverticulosis.  Dr Fuller Plan  . ESOPHAGOGASTRODUODENOSCOPY N/A 08/18/2012   Procedure: ESOPHAGOGASTRODUODENOSCOPY (EGD);  Surgeon: Irene Shipper, MD;  Location: [facility] ENDOSCOPY;  Service: Endoscopy;  Laterality: N/A;  . HERNIA REPAIR  age 69  . INGUINAL HERNIA REPAIR Right 09/17/2018   Procedure: LAPAROSCOPIC REPAIR RIGHT INGUINAL HERNIA;  Surgeon: Fanny Skates, MD;  Location: [place];  Service: General;  Laterality: Right;  . SMALL INTESTINE SURGERY    . TONSILLECTOMY     age 28  . UPPER GASTROINTESTINAL ENDOSCOPY    . VASECTOMY     Allergies  Allergen Reactions  . Codeine Nausea And Vomiting  . Sulfa Antibiotics Rash   Prior to Admission medications   Medication Sig Start Date End Date Taking? Authorizing Provider  Acetaminophen (TYLENOL PO) Take by mouth at bedtime.    Yes [provider]  diphenhydrAMINE (BENADRYL) 25 MG tablet Take 25 mg by mouth at bedtime as needed.   Yes [provider]  Glucosamine-Chondroit-Vit C-Mn (GLUCOSAMINE CHONDR 500 COMPLEX PO) Take 1 tablet by mouth daily.   Yes [provider]  Melatonin 5 MG TABS  Take by mouth at bedtime.   Yes [provider]  Multiple Vitamins-Minerals (MULTIVITAMIN PO) Take by mouth daily. chewable    Yes [provider]  omeprazole (PRILOSEC) 20 MG capsule Take 20 mg by mouth daily.   Yes [provider]  POTASSIUM GLUCONATE PO Take 550 mg by mouth every other day.    Yes [provider]  Zinc 30 MG TABS Take 50 mg by mouth daily.    Yes [provider]   Social History   Socioeconomic History  . Marital status: Married    Spouse name: Not on file  . Number of children: 1  . Years of education: Not on file  . Highest education level: Master's degree (e.g., MA, MS, MEng, MEd, MSW, MBA)  Occupational History  . Occupation: Engineer, maintenance (IT)  Social Needs  . Financial resource strain: Not hard at all  . Food insecurity    Worry: Never true    Inability: Never true  . Transportation needs    Medical: No    Non-medical: No  Tobacco Use  . Smoking status: Never Smoker  . Smokeless tobacco: Never Used  Substance and Sexual Activity  . Alcohol use: Yes    Comment: 1 quart of day-5 glasses wine a day   . Drug use: No  . Sexual activity: Yes    Comment: number of sex partners in the last 15 months  1 ([name])  Lifestyle  . Physical activity    Days per week: 5 days    Minutes per session: 30 min  . Stress: To some extent  Relationships  . Social connections    Talks on phone: More than three times a week    Gets together: Never    Attends religious service: More than 4 times per year    Active member of club or organization: No    Attends meetings of clubs or organizations: Never    Relationship status: Married  . Intimate partner violence    Fear of current or ex partner: No    Emotionally abused: No    Physically abused: No    Forced sexual activity: No  Other Topics Concern  . Not on file  Social History Narrative   Exercise walking 2 times a week for 1 mile   Married   Engineer, maintenance (IT)   MBA    Review of Systems  Per HPI.     Objective:   Physical Exam Vitals signs reviewed.  Constitutional:      Appearance: He is well-developed.  HENT:     Head:  Normocephalic and atraumatic.  Eyes:     Pupils: Pupils are equal, round, and reactive to light.  Neck:     Vascular: No carotid bruit or JVD.  Cardiovascular:     Rate and Rhythm: Normal rate and regular rhythm.     Heart sounds: Normal heart sounds. No murmur.  Pulmonary:     Effort: Pulmonary effort is normal.     Breath sounds: Normal breath sounds. No rales.  Skin:    General: Skin is warm and dry.  Neurological:     Mental Status: He is alert and oriented to person, place, and time.    Vitals:   09/29/18 0811  BP: 118/68  Pulse: 79  Temp: 98.3 F (36.8 C)  TempSrc: Oral  SpO2: 99%  Weight: 155 lb (70.3 kg)  Height: 5' 11.5" (1.816 m)          Assessment & Plan:  Christopher Hendricks is a 73 y.o. male Hyperlipidemia, unspecified hyperlipidemia type - Plan: Lipid Panel  -Slight elevation with slight elevated ASCVD risk previously.  Holding on statin at this time.  Potential risks and side effects were discussed.  Will repeat labs.  Borderline bilirubin but overall stable from previous readings.  Recheck in the next 6 months.  Continue to try to minimize alcohol intake, but commended on decrease recently.   No orders of the defined types were placed in this encounter.  Patient Instructions     I will check cholesterol levels again today and then we can discuss potential need or options for statin depending on those results.  Bilirubin was borderline elevated, but stable from prior readings.  We can recheck that in the next 3 to 6 months.  Continue to try to minimize alcohol intake.  Thank you for coming in today, and let me know if there are questions.  If you have lab work done today you will be contacted with your lab results within the next 2 weeks.  If you have not heard from [place] then please contact us. The fastest way to  get your results is to register for My Chart.   IF you received an x-ray today, you will receive an invoice from [facility]. Please contact [facility] at [phone] with questions or concerns regarding your invoice.   IF you received labwork today, you will receive an invoice from [facility]. Please contact [facility] at [phone] with questions or concerns regarding your invoice.   Our billing staff will not be able to assist you with questions regarding bills from these companies.  You will be contacted with the lab results as soon as they are available. The fastest way to get your results is to activate your My Chart account. Instructions are located on the last page of this paperwork. If you have not heard from [place] regarding the results in 2 weeks, please contact this office.       Signed,   Merri Ray, MD [facility] [place].  09/29/18 9:13 AM

## 2018-09-30 LAB — LIPID PANEL
Chol/HDL Ratio: 3.6 ratio (ref 0.0–5.0)
Cholesterol, Total: 206 mg/dL — ABNORMAL HIGH (ref 100–199)
HDL: 57 mg/dL (ref >39)
LDL Calculated: 131 mg/dL — ABNORMAL HIGH (ref 0–99)
Triglycerides: 89 mg/dL (ref 0–149)
VLDL Cholesterol Cal: 18 mg/dL (ref 5–40)

## 2018-10-13 ENCOUNTER — Encounter: Payer: Self-pay | Admitting: Family Medicine

## 2018-10-18 DIAGNOSIS — L570 Actinic keratosis: Secondary | ICD-10-CM | POA: Diagnosis not present

## 2018-10-18 DIAGNOSIS — L814 Other melanin hyperpigmentation: Secondary | ICD-10-CM | POA: Diagnosis not present

## 2018-10-18 DIAGNOSIS — D225 Melanocytic nevi of trunk: Secondary | ICD-10-CM | POA: Diagnosis not present

## 2018-10-18 DIAGNOSIS — L821 Other seborrheic keratosis: Secondary | ICD-10-CM | POA: Diagnosis not present

## 2018-10-18 DIAGNOSIS — D1801 Hemangioma of skin and subcutaneous tissue: Secondary | ICD-10-CM | POA: Diagnosis not present

## 2019-03-04 ENCOUNTER — Telehealth: Payer: Self-pay | Admitting: Family Medicine

## 2019-03-04 ENCOUNTER — Encounter: Payer: Self-pay | Admitting: Family Medicine

## 2019-03-04 NOTE — Telephone Encounter (Signed)
Pt would like to know if he can come in early to give blood work before cpe appt on 03/30/19 @11 :00 am   Please advise

## 2019-03-26 ENCOUNTER — Encounter: Payer: Self-pay | Admitting: Family Medicine

## 2019-03-30 ENCOUNTER — Encounter: Payer: PPO | Admitting: Family Medicine

## 2019-04-04 ENCOUNTER — Ambulatory Visit (INDEPENDENT_AMBULATORY_CARE_PROVIDER_SITE_OTHER): Payer: PPO | Admitting: Family Medicine

## 2019-04-04 VITALS — BP 130/76 | Ht 71.5 in | Wt 155.0 lb

## 2019-04-04 DIAGNOSIS — Z0000 Encounter for general adult medical examination without abnormal findings: Secondary | ICD-10-CM

## 2019-04-04 NOTE — Progress Notes (Signed)
Presents today for [employer] Visit   Date of last exam: 09-29-2018  Interpreter used for this visit? No  I connected with  Christopher Hendricks on 04/04/19 by telephone application and verified that I am speaking with the correct person using two identifiers.   I discussed the limitations of evaluation and management by telemedicine. The patient expressed understanding and agreed to proceed.   Patient Care Team: [name], MD as PCP - General (Family Medicine) Ladene Artist, MD as Consulting Physician (Gastroenterology) Harriett Sine, MD as Consulting Physician (Dermatology)   Other items to address today:   Discussed immunizations  Patient was in a study at [place] with Prevnar 20 and Flu-shots if they can be given at same time.   Discussed Eye/Dental Follow up appointment scheduled physical Dr. Mitchel Honour  04-07-2019  Other Screening:  Last lipid screening:09-29-2018  ADVANCE DIRECTIVES: Discussed:  yes On File: yes Materials Provided: no   Immunization status:  Immunization History  Administered Date(s) Administered   Influenza Split 02/24/2012   Influenza,inj,Quad PF,6+ Mos 03/01/2013, 03/10/2014, 03/01/2015, 03/13/2016, 03/18/2017   Pneumococcal Conjugate-13 03/01/2015   Pneumococcal Polysaccharide-23 08/18/2012   Zoster 09/18/2012     Health Maintenance Due  Topic Date Due   INFLUENZA VACCINE  11/13/2018     Functional Status Survey: Is the patient deaf or have difficulty hearing?: No Does the patient have difficulty seeing, even when wearing glasses/contacts?: No Does the patient have difficulty concentrating, remembering, or making decisions?: No Does the patient have difficulty walking or climbing stairs?: No Does the patient have difficulty dressing or bathing?: No Does the patient have difficulty doing errands alone such as visiting a doctor's office or shopping?: No   6CIT Screen 04/04/2019 03/31/2018  03/18/2017  What Year? 0 points 0 points 0 points  What month? 0 points 0 points 0 points  What time? 0 points 0 points 0 points  Count back from 20 0 points 0 points 0 points  Months in reverse 0 points 0 points 0 points  Repeat phrase 0 points 0 points 2 points  Total Score 0 0 2        Clinical Support from 04/04/2019 in [place] [facility] [place]  AUDIT-C Score  9       Home Environment:   Lives in one story home with wife Yes scattered rugs  Yes grab bars Adequate lighting/no clutter No trouble climbing stairs uses hand rails more often.  Taking  Care of wife with leukima  Patient wife is having gallbladder surgery in near future.   Had hernia surgery in June went well   Patient Active Problem List   Diagnosis Date Noted   Right inguinal hernia 09/17/2018   Duodenal ulcer with hemorrhage 08/18/2012   Acute posthemorrhagic anemia 08/18/2012   GI bleed 08/17/2012   Anemia 08/17/2012   Alcohol abuse 08/17/2012     Past Medical History:  Diagnosis Date   Alcoholism /alcohol abuse ([place])    Allergy    Arthritis    Blood transfusion without reported diagnosis    Duodenal ulcer    Heart murmur    as teen   Neuromuscular disorder ([place])    pinched nerve in neck that causes numbness in fingers on right hand    Right inguinal hernia 09/17/2018   Sigmoid diverticulosis 2007     Past Surgical History:  Procedure Laterality Date   COLONOSCOPY  2007   sigmoid diverticulosis.  Dr Fuller Plan   ESOPHAGOGASTRODUODENOSCOPY  N/A 08/18/2012   Procedure: ESOPHAGOGASTRODUODENOSCOPY (EGD);  Surgeon: Irene Shipper, MD;  Location: [facility] ENDOSCOPY;  Service: Endoscopy;  Laterality: N/A;   HERNIA REPAIR     age 73   INGUINAL HERNIA REPAIR Right 09/17/2018   Procedure: LAPAROSCOPIC REPAIR RIGHT INGUINAL HERNIA;  Surgeon: Fanny Skates, MD;  Location: [place];  Service: General;  Laterality: Right;   SMALL INTESTINE SURGERY     TONSILLECTOMY     age 6    UPPER GASTROINTESTINAL ENDOSCOPY     VASECTOMY       Family History  Adopted: Yes  Problem Relation Age of Onset   Diabetes Daughter      Social History   Socioeconomic History   Marital status: Married    Spouse name: Not on file   Number of children: 1   Years of education: Not on file   Highest education level: Master's degree (e.g., MA, MS, MEng, MEd, MSW, MBA)  Occupational History   Occupation: Engineer, maintenance (IT)  Tobacco Use   Smoking status: Never Smoker   Smokeless tobacco: Never Used  Substance and Sexual Activity   Alcohol use: Yes    Comment: 1 quart of day-5 glasses wine a day    Drug use: No   Sexual activity: Yes    Comment: number of sex partners in the last 66 months  1 ([name])  Other Topics Concern   Not on file  Social History Narrative   Exercise walking 2 times a week for 1 mile   Married   Charity fundraiser   Social Determinants of Health   Financial Resource Strain:    Difficulty of Paying Living Expenses: Not on file  Food Insecurity:    Worried About Charity fundraiser in the Last Year: Not on file   [employer] of Food in the Last Year: Not on file  Transportation Needs:    Lack of Transportation (Medical): Not on file   Lack of Transportation (Non-Medical): Not on file  Physical Activity:    Days of Exercise per Week: Not on file   Minutes of Exercise per Session: Not on file  Stress:    Feeling of Stress : Not on file  Social Connections:    Frequency of Communication with Friends and Family: Not on file   Frequency of Social Gatherings with Friends and Family: Not on file   Attends Religious Services: Not on file   Active Member of Clubs or Organizations: Not on file   Attends Archivist Meetings: Not on file   Marital Status: Not on file  Intimate Partner Violence:    Fear of Current or Ex-Partner: Not on file   Emotionally Abused: Not on file   Physically Abused: Not on file   Sexually Abused: Not on  file     Allergies  Allergen Reactions   Codeine Nausea And Vomiting   Sulfa Antibiotics Rash     Prior to Admission medications   Medication Sig Start Date End Date Taking? Authorizing Provider  Multiple Vitamins-Minerals (MULTIVITAMIN PO) Take by mouth daily. chewable    Yes [provider]  omeprazole (PRILOSEC) 20 MG capsule Take 20 mg by mouth daily.   Yes [provider]  POTASSIUM GLUCONATE PO Take 550 mg by mouth every other day.    Yes [provider]  Zinc 30 MG TABS Take 50 mg by mouth daily.    Yes [provider]  Acetaminophen (TYLENOL PO) Take by mouth  at bedtime.     [provider]  diphenhydrAMINE (BENADRYL) 25 MG tablet Take 25 mg by mouth at bedtime as needed.    [provider]  Glucosamine-Chondroit-Vit C-Mn (GLUCOSAMINE CHONDR 500 COMPLEX PO) Take 1 tablet by mouth daily.    [provider]  Melatonin 5 MG TABS Take by mouth at bedtime.    [provider]     Depression screen [facility] 2/9 04/04/2019 09/29/2018 09/29/2018 03/31/2018 12/15/2017  Decreased Interest 0 0 0 0 0  Down, Depressed, Hopeless 0 0 0 0 0  PHQ - 2 Score 0 0 0 0 0     Fall Risk  04/04/2019 09/29/2018 09/29/2018 03/31/2018 12/15/2017  Falls in the past year? 0 0 0 0 No  Number falls in past yr: 0 0 0 - -  Injury with Fall? 0 0 0 - -  Follow up Falls evaluation completed;Education provided Falls evaluation completed Falls evaluation completed - -      PHYSICAL EXAM: BP 130/76 Comment: taken from previous visit   Ht 5' 11.5" (1.816 m)    Wt 155 lb (70.3 kg)    BMI 21.32 kg/m    Wt Readings from Last 3 Encounters:  04/04/19 155 lb (70.3 kg)  09/29/18 155 lb (70.3 kg)  09/17/18 147 lb (66.7 kg)       Education/Counseling provided regarding diet and exercise, prevention of chronic diseases, smoking/tobacco cessation, if applicable, and reviewed "Covered Medicare Preventive Services."

## 2019-04-04 NOTE — Patient Instructions (Addendum)
Thank you for taking time to come for your Medicare Wellness Visit. I appreciate your ongoing commitment to your health goals. Please review the following plan we discussed and let me know if I can assist you in the future.  Christopher Kennedy LPN  Preventive Care 72 Years and Older, Male Preventive care refers to lifestyle choices and visits with your health care provider that can promote health and wellness. This includes:  A yearly physical exam. This is also called an annual well check.  Regular dental and eye exams.  Immunizations.  Screening for certain conditions.  Healthy lifestyle choices, such as diet and exercise. What can I expect for my preventive care visit? Physical exam Your health care provider will check:  Height and weight. These may be used to calculate body mass index (BMI), which is a measurement that tells if you are at a healthy weight.  Heart rate and blood pressure.  Your skin for abnormal spots. Counseling Your health care provider may ask you questions about:  Alcohol, tobacco, and drug use.  Emotional well-being.  Home and relationship well-being.  Sexual activity.  Eating habits.  History of falls.  Memory and ability to understand (cognition).  Work and work Statistician. What immunizations do I need?  Influenza (flu) vaccine  This is recommended every year. Tetanus, diphtheria, and pertussis (Tdap) vaccine  You may need a Td booster every 10 years. Varicella (chickenpox) vaccine  You may need this vaccine if you have not already been vaccinated. Zoster (shingles) vaccine  You may need this after age 66. Pneumococcal conjugate (PCV13) vaccine  One dose is recommended after age 84. Pneumococcal polysaccharide (PPSV23) vaccine  One dose is recommended after age 88. Measles, mumps, and rubella (MMR) vaccine  You may need at least one dose of MMR if you were born in 1957 or later. You may also need a second dose. Meningococcal  conjugate (MenACWY) vaccine  You may need this if you have certain conditions. Hepatitis A vaccine  You may need this if you have certain conditions or if you travel or work in places where you may be exposed to hepatitis A. Hepatitis B vaccine  You may need this if you have certain conditions or if you travel or work in places where you may be exposed to hepatitis B. Haemophilus influenzae type b (Hib) vaccine  You may need this if you have certain conditions. You may receive vaccines as individual doses or as more than one vaccine together in one shot (combination vaccines). Talk with your health care provider about the risks and benefits of combination vaccines. What tests do I need? Blood tests  Lipid and cholesterol levels. These may be checked every 5 years, or more frequently depending on your overall health.  Hepatitis C test.  Hepatitis B test. Screening  Lung cancer screening. You may have this screening every year starting at age 24 if you have a 30-pack-year history of smoking and currently smoke or have quit within the past 15 years.  Colorectal cancer screening. All adults should have this screening starting at age 52 and continuing until age 61. Your health care provider may recommend screening at age 57 if you are at increased risk. You will have tests every 1-10 years, depending on your results and the type of screening test.  Prostate cancer screening. Recommendations will vary depending on your family history and other risks.  Diabetes screening. This is done by checking your blood sugar (glucose) after you have not eaten for  a while (fasting). You may have this done every 1-3 years.  Abdominal aortic aneurysm (AAA) screening. You may need this if you are a current or former smoker.  Sexually transmitted disease (STD) testing. Follow these instructions at home: Eating and drinking  Eat a diet that includes fresh fruits and vegetables, whole grains, lean  protein, and low-fat dairy products. Limit your intake of foods with high amounts of sugar, saturated fats, and salt.  Take vitamin and mineral supplements as recommended by your health care provider.  Do not drink alcohol if your health care provider tells you not to drink.  If you drink alcohol: ? Limit how much you have to 0-2 drinks a day. ? Be aware of how much alcohol is in your drink. In the [place] one drink equals one 12 oz bottle of beer (355 mL), one 5 oz glass of wine (148 mL), or one 1 oz glass of hard liquor (44 mL). Lifestyle  Take daily care of your teeth and gums.  Stay active. Exercise for at least 30 minutes on 5 or more days each week.  Do not use any products that contain nicotine or tobacco, such as cigarettes, e-cigarettes, and chewing tobacco. If you need help quitting, ask your health care provider.  If you are sexually active, practice safe sex. Use a condom or other form of protection to prevent STIs (sexually transmitted infections).  Talk with your health care provider about taking a low-dose aspirin or statin. What's next?  Visit your health care provider once a year for a well check visit.  Ask your health care provider how often you should have your eyes and teeth checked.  Stay up to date on all vaccines. This information is not intended to replace advice given to you by your health care provider. Make sure you discuss any questions you have with your health care provider. Document Released: 04/27/2015 Document Revised: 03/25/2018 Document Reviewed: 03/25/2018 Elsevier Patient Education  2020 [employer].

## 2019-04-07 ENCOUNTER — Ambulatory Visit (INDEPENDENT_AMBULATORY_CARE_PROVIDER_SITE_OTHER): Payer: PPO | Admitting: Emergency Medicine

## 2019-04-07 ENCOUNTER — Other Ambulatory Visit: Payer: Self-pay

## 2019-04-07 ENCOUNTER — Encounter: Payer: Self-pay | Admitting: Emergency Medicine

## 2019-04-07 VITALS — BP 120/68 | HR 72 | Temp 97.6°F | Resp 17 | Ht 71.5 in | Wt 147.0 lb

## 2019-04-07 DIAGNOSIS — Z130 Encounter for screening for diseases of the blood and blood-forming organs and certain disorders involving the immune mechanism: Secondary | ICD-10-CM

## 2019-04-07 DIAGNOSIS — D649 Anemia, unspecified: Secondary | ICD-10-CM | POA: Diagnosis not present

## 2019-04-07 DIAGNOSIS — Z1321 Encounter for screening for nutritional disorder: Secondary | ICD-10-CM

## 2019-04-07 DIAGNOSIS — E785 Hyperlipidemia, unspecified: Secondary | ICD-10-CM

## 2019-04-07 DIAGNOSIS — Z13228 Encounter for screening for other metabolic disorders: Secondary | ICD-10-CM

## 2019-04-07 DIAGNOSIS — Z23 Encounter for immunization: Secondary | ICD-10-CM

## 2019-04-07 DIAGNOSIS — Z1329 Encounter for screening for other suspected endocrine disorder: Secondary | ICD-10-CM

## 2019-04-07 DIAGNOSIS — Z0000 Encounter for general adult medical examination without abnormal findings: Secondary | ICD-10-CM

## 2019-04-07 DIAGNOSIS — Z0001 Encounter for general adult medical examination with abnormal findings: Secondary | ICD-10-CM

## 2019-04-07 NOTE — Patient Instructions (Signed)
Health Maintenance After Age 72 After age 72, you are at a higher risk for certain long-term diseases and infections as well as injuries from falls. Falls are a major cause of broken bones and head injuries in people who are older than age 72. Getting regular preventive care can help to keep you healthy and well. Preventive care includes getting regular testing and making lifestyle changes as recommended by your health care provider. Talk with your health care provider about:  Which screenings and tests you should have. A screening is a test that checks for a disease when you have no symptoms.  A diet and exercise plan that is right for you. What should I know about screenings and tests to prevent falls? Screening and testing are the best ways to find a health problem early. Early diagnosis and treatment give you the best chance of managing medical conditions that are common after age 72. Certain conditions and lifestyle choices may make you more likely to have a fall. Your health care provider may recommend:  Regular vision checks. Poor vision and conditions such as cataracts can make you more likely to have a fall. If you wear glasses, make sure to get your prescription updated if your vision changes.  Medicine review. Work with your health care provider to regularly review all of the medicines you are taking, including over-the-counter medicines. Ask your health care provider about any side effects that may make you more likely to have a fall. Tell your health care provider if any medicines that you take make you feel dizzy or sleepy.  Osteoporosis screening. Osteoporosis is a condition that causes the bones to get weaker. This can make the bones weak and cause them to break more easily.  Blood pressure screening. Blood pressure changes and medicines to control blood pressure can make you feel dizzy.  Strength and balance checks. Your health care provider may recommend certain tests to check your  strength and balance while standing, walking, or changing positions.  Foot health exam. Foot pain and numbness, as well as not wearing proper footwear, can make you more likely to have a fall.  Depression screening. You may be more likely to have a fall if you have a fear of falling, feel emotionally low, or feel unable to do activities that you used to do.  Alcohol use screening. Using too much alcohol can affect your balance and may make you more likely to have a fall. What actions can I take to lower my risk of falls? General instructions  Talk with your health care provider about your risks for falling. Tell your health care provider if: ? You fall. Be sure to tell your health care provider about all falls, even ones that seem minor. ? You feel dizzy, sleepy, or off-balance.  Take over-the-counter and prescription medicines only as told by your health care provider. These include any supplements.  Eat a healthy diet and maintain a healthy weight. A healthy diet includes low-fat dairy products, low-fat (lean) meats, and fiber from whole grains, beans, and lots of fruits and vegetables. Home safety  Remove any tripping hazards, such as rugs, cords, and clutter.  Install safety equipment such as grab bars in bathrooms and safety rails on stairs.  Keep rooms and walkways well-lit. Activity   Follow a regular exercise program to stay fit. This will help you maintain your balance. Ask your health care provider what types of exercise are appropriate for you.  If you need a cane or   walker, use it as recommended by your health care provider.  Wear supportive shoes that have nonskid soles. Lifestyle  Do not drink alcohol if your health care provider tells you not to drink.  If you drink alcohol, limit how much you have: ? 0-1 drink a day for women. ? 0-2 drinks a day for men.  Be aware of how much alcohol is in your drink. In the [place] one drink equals one typical bottle of beer (12  oz), one-half glass of wine (5 oz), or one shot of hard liquor (1 oz).  Do not use any products that contain nicotine or tobacco, such as cigarettes and e-cigarettes. If you need help quitting, ask your health care provider. Summary  Having a healthy lifestyle and getting preventive care can help to protect your health and wellness after age 72.  Screening and testing are the best way to find a health problem early and help you avoid having a fall. Early diagnosis and treatment give you the best chance for managing medical conditions that are more common for people who are older than age 72.  Falls are a major cause of broken bones and head injuries in people who are older than age 72. Take precautions to prevent a fall at home.  Work with your health care provider to learn what changes you can make to improve your health and wellness and to prevent falls. This information is not intended to replace advice given to you by your health care provider. Make sure you discuss any questions you have with your health care provider. Document Released: 02/11/2017 Document Revised: 07/22/2018 Document Reviewed: 02/11/2017 Elsevier Patient Education  2020 Elsevier Inc.  

## 2019-04-07 NOTE — Progress Notes (Signed)
Christopher Hendricks 73 y.o.   Chief Complaint  Patient presents with  . Annual Exam    cpe    HISTORY OF PRESENT ILLNESS: This is a 73 y.o. male here for annual exam. No complaints or medical concerns. Medical record reviewed. Non-smoker. Inguinal hernia successfully repaired earlier this year. History of bleeding ulcer in the past.  No complaints.  HPI   Prior to Admission medications   Medication Sig Start Date End Date Taking? Authorizing Provider  Acetaminophen (TYLENOL PO) Take by mouth at bedtime.    Yes [provider]  diphenhydrAMINE (BENADRYL) 25 MG tablet Take 25 mg by mouth at bedtime as needed.   Yes [provider]  Melatonin 5 MG TABS Take by mouth at bedtime.   Yes [provider]  Multiple Vitamins-Minerals (MULTIVITAMIN PO) Take by mouth daily. chewable    Yes [provider]  omeprazole (PRILOSEC) 20 MG capsule Take 20 mg by mouth daily.   Yes [provider]  POTASSIUM GLUCONATE PO Take 550 mg by mouth every other day.    Yes [provider]  Zinc 30 MG TABS Take 50 mg by mouth daily.    Yes [provider]  Glucosamine-Chondroit-Vit C-Mn (GLUCOSAMINE CHONDR 500 COMPLEX PO) Take 1 tablet by mouth daily.    [provider]    Allergies  Allergen Reactions  . Codeine Nausea And Vomiting  . Sulfa Antibiotics Rash    Patient Active Problem List   Diagnosis Date Noted  . Right inguinal hernia 09/17/2018  . Anemia 08/17/2012  . Alcohol abuse 08/17/2012    Past Medical History:  Diagnosis Date  . Alcoholism /alcohol abuse ([place])   . Allergy   . Arthritis   . Blood transfusion without reported diagnosis   . Duodenal ulcer   . Heart murmur    as teen  . Neuromuscular disorder ([place])    pinched nerve in neck that causes numbness in fingers on right hand   . Right inguinal hernia 09/17/2018  . Sigmoid diverticulosis 2007    Past Surgical History:  Procedure Laterality Date  .  COLONOSCOPY  2007   sigmoid diverticulosis.  Dr Fuller Plan  . ESOPHAGOGASTRODUODENOSCOPY N/A 08/18/2012   Procedure: ESOPHAGOGASTRODUODENOSCOPY (EGD);  Surgeon: Irene Shipper, MD;  Location: [facility] END[facility]COPY;  Service: Endoscopy;  Laterality: N/A;  . HERNIA REPAIR     age 13  . INGUINAL HERNIA REPAIR Right 09/17/2018   Procedure: LAPAROSCOPIC REPAIR RIGHT INGUINAL HERNIA;  Surgeon: Fanny Skates, MD;  Location: [facility];  Service: General;  Laterality: Right;  . SMALL INTESTINE SURGERY    . TONSILLECTOMY     age 65  . UPPER GASTROINTESTINAL ENDOSCOPY    . VASECTOMY      Social History   Socioeconomic History  . Marital status: Married    Spouse name: Not on file  . Number of children: 1  . Years of education: Not on file  . Highest education level: Master's degree (e.g., MA, MS, MEng, MEd, MSW, MBA)  Occupational History  . Occupation: Engineer, maintenance (IT)  Tobacco Use  . Smoking status: Never Smoker  . Smokeless tobacco: Never Used  Substance and Sexual Activity  . Alcohol use: Yes    Comment: 1 quart of day-5 glasses wine a day   . Drug use: No  . Sexual activity: Yes    Comment: number of sex partners in the last 32 months  1 Joycelyn Schmid)  Other Topics Concern  . Not on file  Social History Narrative   Exercise walking 2 times a week for 1 mile   Married   Charity fundraiser   Social Determinants of Health   Financial Resource Strain:   . Difficulty of Paying Living Expenses: Not on file  Food Insecurity:   . Worried About Charity fundraiser in the Last Year: Not on file  . Ran Out of Food in the Last Year: Not on file  Transportation Needs:   . Lack of Transportation (Medical): Not on file  . Lack of Transportation (Non-Medical): Not on file  Physical Activity:   . Days of Exercise per Week: Not on file  . Minutes of Exercise per Session: Not on file  Stress:   . Feeling of Stress : Not on file  Social Connections:   . Frequency of Communication with Friends and Family: Not on  file  . Frequency of Social Gatherings with Friends and Family: Not on file  . Attends Religious Services: Not on file  . Active Member of Clubs or Organizations: Not on file  . Attends Archivist Meetings: Not on file  . Marital Status: Not on file  Intimate Partner Violence:   . Fear of Current or Ex-Partner: Not on file  . Emotionally Abused: Not on file  . Physically Abused: Not on file  . Sexually Abused: Not on file    Family History  Adopted: Yes  Problem Relation Age of Onset  . Diabetes Daughter      Review of Systems  Constitutional: Negative.  Negative for chills, fever and weight loss.  HENT: Negative.  Negative for congestion and sore throat.   Eyes: Negative.   Respiratory: Negative.  Negative for cough and shortness of breath.   Cardiovascular: Negative.  Negative for chest pain and palpitations.  Gastrointestinal: Negative.  Negative for abdominal pain, blood in stool, melena, nausea and vomiting.  Genitourinary: Negative.  Negative for dysuria and hematuria.  Musculoskeletal: Negative.  Negative for back pain, myalgias and neck pain.  Skin: Negative.  Negative for rash.  Neurological: Negative for dizziness and headaches.  All other systems reviewed and are negative.    Vitals:   04/07/19 0809  Pulse: 72  Resp: 17  Temp: 97.6 F (36.4 C)  SpO2: 96%     Physical Exam Vitals reviewed.  Constitutional:      Appearance: Normal appearance.  HENT:     Head: Normocephalic.  Eyes:     Extraocular Movements: Extraocular movements intact.     Conjunctiva/sclera: Conjunctivae normal.     Pupils: Pupils are equal, round, and reactive to light.  Neck:     Vascular: No carotid bruit.  Cardiovascular:     Rate and Rhythm: Normal rate and regular rhythm.     Pulses: Normal pulses.     Heart sounds: Normal heart sounds.  Pulmonary:     Effort: Pulmonary effort is normal.     Breath sounds: Normal breath sounds.  Abdominal:     General: Bowel  sounds are normal. There is no distension.     Palpations: Abdomen is soft.     Tenderness: There is no abdominal tenderness.  Musculoskeletal:        General: Normal range of motion.     Cervical back: Normal range of motion and neck supple.  Lymphadenopathy:     Cervical: No cervical adenopathy.  Skin:    General: Skin is warm and dry.     Capillary Refill: Capillary refill takes  less than 2 seconds.  Neurological:     General: No focal deficit present.     Mental Status: He is alert and oriented to person, place, and time.      ASSESSMENT & PLAN: Copen was seen today for annual exam.  Diagnoses and all orders for this visit:  Encounter for Medicare annual wellness exam  Hyperlipidemia, unspecified hyperlipidemia type -     Comprehensive metabolic panel -     Lipid panel  Need for prophylactic vaccination and inoculation against influenza -     Cancel: Flu Vaccine QUAD High Dose(Fluad)  Anemia, unspecified type -     CBC with Differential  Screening for endocrine, nutritional, metabolic and immunity disorder -     Comprehensive metabolic panel    Patient Instructions  Health Maintenance After Age 25 After age 62, you are at a higher risk for certain long-term diseases and infections as well as injuries from falls. Falls are a major cause of broken bones and head injuries in people who are older than age 22. Getting regular preventive care can help to keep you healthy and well. Preventive care includes getting regular testing and making lifestyle changes as recommended by your health care provider. Talk with your health care provider about:  Which screenings and tests you should have. A screening is a test that checks for a disease when you have no symptoms.  A diet and exercise plan that is right for you. What should I know about screenings and tests to prevent falls? Screening and testing are the best ways to find a health problem early. Early diagnosis and  treatment give you the best chance of managing medical conditions that are common after age 51. Certain conditions and lifestyle choices may make you more likely to have a fall. Your health care provider may recommend:  Regular vision checks. Poor vision and conditions such as cataracts can make you more likely to have a fall. If you wear glasses, make sure to get your prescription updated if your vision changes.  Medicine review. Work with your health care provider to regularly review all of the medicines you are taking, including over-the-counter medicines. Ask your health care provider about any side effects that may make you more likely to have a fall. Tell your health care provider if any medicines that you take make you feel dizzy or sleepy.  Osteoporosis screening. Osteoporosis is a condition that causes the bones to get weaker. This can make the bones weak and cause them to break more easily.  Blood pressure screening. Blood pressure changes and medicines to control blood pressure can make you feel dizzy.  Strength and balance checks. Your health care provider may recommend certain tests to check your strength and balance while standing, walking, or changing positions.  Foot health exam. Foot pain and numbness, as well as not wearing proper footwear, can make you more likely to have a fall.  Depression screening. You may be more likely to have a fall if you have a fear of falling, feel emotionally low, or feel unable to do activities that you used to do.  Alcohol use screening. Using too much alcohol can affect your balance and may make you more likely to have a fall. What actions can I take to lower my risk of falls? General instructions  Talk with your health care provider about your risks for falling. Tell your health care provider if: ? You fall. Be sure to tell your health care provider about  all falls, even ones that seem minor. ? You feel dizzy, sleepy, or off-balance.  Take  over-the-counter and prescription medicines only as told by your health care provider. These include any supplements.  Eat a healthy diet and maintain a healthy weight. A healthy diet includes low-fat dairy products, low-fat (lean) meats, and fiber from whole grains, beans, and lots of fruits and vegetables. Home safety  Remove any tripping hazards, such as rugs, cords, and clutter.  Install safety equipment such as grab bars in bathrooms and safety rails on stairs.  Keep rooms and walkways well-lit. Activity   Follow a regular exercise program to stay fit. This will help you maintain your balance. Ask your health care provider what types of exercise are appropriate for you.  If you need a cane or walker, use it as recommended by your health care provider.  Wear supportive shoes that have nonskid soles. Lifestyle  Do not drink alcohol if your health care provider tells you not to drink.  If you drink alcohol, limit how much you have: ? 0-1 drink a day for women. ? 0-2 drinks a day for men.  Be aware of how much alcohol is in your drink. In the [place] one drink equals one typical bottle of beer (12 oz), one-half glass of wine (5 oz), or one shot of hard liquor (1 oz).  Do not use any products that contain nicotine or tobacco, such as cigarettes and e-cigarettes. If you need help quitting, ask your health care provider. Summary  Having a healthy lifestyle and getting preventive care can help to protect your health and wellness after age 51.  Screening and testing are the best way to find a health problem early and help you avoid having a fall. Early diagnosis and treatment give you the best chance for managing medical conditions that are more common for people who are older than age 41.  Falls are a major cause of broken bones and head injuries in people who are older than age 52. Take precautions to prevent a fall at home.  Work with your health care provider to learn what changes  you can make to improve your health and wellness and to prevent falls. This information is not intended to replace advice given to you by your health care provider. Make sure you discuss any questions you have with your health care provider. Document Released: 02/11/2017 Document Revised: 07/22/2018 Document Reviewed: 02/11/2017 Elsevier Patient Education  2020 Elsevier Inc.      Agustina Caroli, MD [facility]

## 2019-04-08 LAB — CBC WITH DIFFERENTIAL/PLATELET
Basophils Absolute: 0 10*3/uL (ref 0.0–0.2)
Basos: 1 %
EOS (ABSOLUTE): 0.1 10*3/uL (ref 0.0–0.4)
Eos: 2 %
Hematocrit: 44.3 % (ref 37.5–51.0)
Hemoglobin: 15.1 g/dL (ref 13.0–17.7)
Immature Grans (Abs): 0 10*3/uL (ref 0.0–0.1)
Immature Granulocytes: 0 %
Lymphocytes Absolute: 0.8 10*3/uL (ref 0.7–3.1)
Lymphs: 20 %
MCH: 32.7 pg (ref 26.6–33.0)
MCHC: 34.1 g/dL (ref 31.5–35.7)
MCV: 96 fL (ref 79–97)
Monocytes Absolute: 0.5 10*3/uL (ref 0.1–0.9)
Monocytes: 13 %
Neutrophils Absolute: 2.5 10*3/uL (ref 1.4–7.0)
Neutrophils: 64 %
Platelets: 215 10*3/uL (ref 150–450)
RBC: 4.62 x10E6/uL (ref 4.14–5.80)
RDW: 11.8 % (ref 11.6–15.4)
WBC: 3.9 10*3/uL (ref 3.4–10.8)

## 2019-04-08 LAB — COMPREHENSIVE METABOLIC PANEL
ALT: 44 IU/L (ref 0–44)
AST: 33 IU/L (ref 0–40)
Albumin/Globulin Ratio: 2.1 (ref 1.2–2.2)
Albumin: 4.6 g/dL (ref 3.7–4.7)
Alkaline Phosphatase: 104 IU/L (ref 39–117)
BUN/Creatinine Ratio: 14 (ref 10–24)
BUN: 12 mg/dL (ref 8–27)
Bilirubin Total: 1 mg/dL (ref 0.0–1.2)
CO2: 27 mmol/L (ref 20–29)
Calcium: 9.4 mg/dL (ref 8.6–10.2)
Chloride: 103 mmol/L (ref 96–106)
Creatinine, Ser: 0.83 mg/dL (ref 0.76–1.27)
GFR calc Af Amer: 102 mL/min/{1.73_m2} (ref >59)
GFR calc non Af Amer: 88 mL/min/{1.73_m2} (ref >59)
Globulin, Total: 2.2 g/dL (ref 1.5–4.5)
Glucose: 73 mg/dL (ref 65–99)
Potassium: 4.3 mmol/L (ref 3.5–5.2)
Sodium: 143 mmol/L (ref 134–144)
Total Protein: 6.8 g/dL (ref 6.0–8.5)

## 2019-04-08 LAB — LIPID PANEL
Chol/HDL Ratio: 3.3 ratio (ref 0.0–5.0)
Cholesterol, Total: 192 mg/dL (ref 100–199)
HDL: 58 mg/dL (ref >39)
LDL Chol Calc (NIH): 120 mg/dL — ABNORMAL HIGH (ref 0–99)
Triglycerides: 74 mg/dL (ref 0–149)
VLDL Cholesterol Cal: 14 mg/dL (ref 5–40)

## 2019-04-11 ENCOUNTER — Encounter: Payer: Self-pay | Admitting: Emergency Medicine

## 2019-05-14 ENCOUNTER — Ambulatory Visit: Payer: PPO

## 2019-05-19 ENCOUNTER — Ambulatory Visit: Payer: PPO | Attending: Internal Medicine

## 2019-05-19 ENCOUNTER — Ambulatory Visit: Payer: PPO

## 2019-05-19 DIAGNOSIS — Z23 Encounter for immunization: Secondary | ICD-10-CM

## 2019-05-19 NOTE — Progress Notes (Signed)
   Covid-19 Vaccination Clinic  Name:  Christopher Hendricks    MRN: [id number] DOB: 1945-05-08  05/19/2019  [name] was observed post Covid-19 immunization for 15 minutes without incidence. He was provided with Vaccine Information Sheet and instruction to access the V-Safe system.   [name] was instructed to call 911 with any severe reactions post vaccine: Marland Kitchen Difficulty breathing  . Swelling of your face and throat  . A fast heartbeat  . A bad rash all over your body  . Dizziness and weakness    Immunizations Administered    Name Date Dose VIS Date Route   Pfizer COVID-19 Vaccine 05/19/2019  9:20 AM 0.3 mL 03/25/2019 Intramuscular   Manufacturer: [place]   Lot: [id number]   [place]: [id number]

## 2019-06-13 ENCOUNTER — Ambulatory Visit: Payer: PPO | Attending: Internal Medicine

## 2019-06-13 DIAGNOSIS — Z23 Encounter for immunization: Secondary | ICD-10-CM | POA: Insufficient documentation

## 2019-06-13 NOTE — Progress Notes (Signed)
   Covid-19 Vaccination Clinic  Name:  Christopher Hendricks    MRN: [id number] DOB: [birth year]  06/13/2019  Mr. Shiver was observed post Covid-19 immunization for 15 minutes without incidence. He was provided with Vaccine Information Sheet and instruction to access the V-Safe system.   Mr. Corvi was instructed to call 911 with any severe reactions post vaccine: Marland Kitchen Difficulty breathing  . Swelling of your face and throat  . A fast heartbeat  . A bad rash all over your body  . Dizziness and weakness    Immunizations Administered    Name Date Dose VIS Date Route   Pfizer COVID-19 Vaccine 06/13/2019 10:12 AM 0.3 mL 03/25/2019 Intramuscular   Manufacturer: [place]   Lot: [id number]   [place]: [id number]

## 2019-10-31 DIAGNOSIS — D2272 Melanocytic nevi of left lower limb, including hip: Secondary | ICD-10-CM | POA: Diagnosis not present

## 2019-10-31 DIAGNOSIS — L814 Other melanin hyperpigmentation: Secondary | ICD-10-CM | POA: Diagnosis not present

## 2019-10-31 DIAGNOSIS — L821 Other seborrheic keratosis: Secondary | ICD-10-CM | POA: Diagnosis not present

## 2019-10-31 DIAGNOSIS — D225 Melanocytic nevi of trunk: Secondary | ICD-10-CM | POA: Diagnosis not present

## 2019-10-31 DIAGNOSIS — Z85828 Personal history of other malignant neoplasm of skin: Secondary | ICD-10-CM | POA: Diagnosis not present

## 2019-10-31 DIAGNOSIS — L570 Actinic keratosis: Secondary | ICD-10-CM | POA: Diagnosis not present

## 2020-01-20 ENCOUNTER — Encounter: Payer: Self-pay | Admitting: Family Medicine

## 2020-03-16 ENCOUNTER — Other Ambulatory Visit: Payer: Self-pay

## 2020-03-16 ENCOUNTER — Ambulatory Visit (INDEPENDENT_AMBULATORY_CARE_PROVIDER_SITE_OTHER): Payer: PPO | Admitting: Family Medicine

## 2020-03-16 DIAGNOSIS — E785 Hyperlipidemia, unspecified: Secondary | ICD-10-CM

## 2020-03-16 DIAGNOSIS — D649 Anemia, unspecified: Secondary | ICD-10-CM | POA: Diagnosis not present

## 2020-03-16 LAB — CMP14+EGFR
ALT: 42 IU/L (ref 0–44)
AST: 27 IU/L (ref 0–40)
Albumin/Globulin Ratio: 2.1 (ref 1.2–2.2)
Albumin: 4.6 g/dL (ref 3.7–4.7)
Alkaline Phosphatase: 95 IU/L (ref 44–121)
BUN/Creatinine Ratio: 17 (ref 10–24)
BUN: 13 mg/dL (ref 8–27)
Bilirubin Total: 1.4 mg/dL — ABNORMAL HIGH (ref 0.0–1.2)
CO2: 26 mmol/L (ref 20–29)
Calcium: 9.4 mg/dL (ref 8.6–10.2)
Chloride: 101 mmol/L (ref 96–106)
Creatinine, Ser: 0.75 mg/dL — ABNORMAL LOW (ref 0.76–1.27)
GFR calc Af Amer: 105 mL/min/{1.73_m2} (ref >59)
GFR calc non Af Amer: 91 mL/min/{1.73_m2} (ref >59)
Globulin, Total: 2.2 g/dL (ref 1.5–4.5)
Glucose: 74 mg/dL (ref 65–99)
Potassium: 4.5 mmol/L (ref 3.5–5.2)
Sodium: 141 mmol/L (ref 134–144)
Total Protein: 6.8 g/dL (ref 6.0–8.5)

## 2020-03-16 LAB — CBC WITH DIFFERENTIAL/PLATELET
Basophils Absolute: 0 10*3/uL (ref 0.0–0.2)
Basos: 1 %
EOS (ABSOLUTE): 0.1 10*3/uL (ref 0.0–0.4)
Eos: 2 %
Hematocrit: 43.1 % (ref 37.5–51.0)
Hemoglobin: 14.4 g/dL (ref 13.0–17.7)
Immature Grans (Abs): 0 10*3/uL (ref 0.0–0.1)
Immature Granulocytes: 0 %
Lymphocytes Absolute: 1 10*3/uL (ref 0.7–3.1)
Lymphs: 22 %
MCH: 32.4 pg (ref 26.6–33.0)
MCHC: 33.4 g/dL (ref 31.5–35.7)
MCV: 97 fL (ref 79–97)
Monocytes Absolute: 0.7 10*3/uL (ref 0.1–0.9)
Monocytes: 16 %
Neutrophils Absolute: 2.6 10*3/uL (ref 1.4–7.0)
Neutrophils: 59 %
Platelets: 214 10*3/uL (ref 150–450)
RBC: 4.44 x10E6/uL (ref 4.14–5.80)
RDW: 11.5 % — ABNORMAL LOW (ref 11.6–15.4)
WBC: 4.4 10*3/uL (ref 3.4–10.8)

## 2020-03-16 LAB — LIPID PANEL
Chol/HDL Ratio: 3.4 ratio (ref 0.0–5.0)
Cholesterol, Total: 198 mg/dL (ref 100–199)
HDL: 58 mg/dL (ref >39)
LDL Chol Calc (NIH): 126 mg/dL — ABNORMAL HIGH (ref 0–99)
Triglycerides: 78 mg/dL (ref 0–149)
VLDL Cholesterol Cal: 14 mg/dL (ref 5–40)

## 2020-03-16 NOTE — Progress Notes (Signed)
Lab only visit/nurse visit, patient not evaluated by me.

## 2020-03-21 ENCOUNTER — Ambulatory Visit (INDEPENDENT_AMBULATORY_CARE_PROVIDER_SITE_OTHER): Payer: PPO | Admitting: Family Medicine

## 2020-03-21 ENCOUNTER — Ambulatory Visit: Payer: PPO

## 2020-03-21 ENCOUNTER — Other Ambulatory Visit: Payer: Self-pay

## 2020-03-21 ENCOUNTER — Encounter: Payer: Self-pay | Admitting: Family Medicine

## 2020-03-21 VITALS — BP 129/76 | HR 65 | Temp 97.4°F | Ht 73.5 in | Wt 149.0 lb

## 2020-03-21 DIAGNOSIS — M79674 Pain in right toe(s): Secondary | ICD-10-CM

## 2020-03-21 DIAGNOSIS — Z0001 Encounter for general adult medical examination with abnormal findings: Secondary | ICD-10-CM | POA: Diagnosis not present

## 2020-03-21 DIAGNOSIS — Z0000 Encounter for general adult medical examination without abnormal findings: Secondary | ICD-10-CM

## 2020-03-21 DIAGNOSIS — K4090 Unilateral inguinal hernia, without obstruction or gangrene, not specified as recurrent: Secondary | ICD-10-CM

## 2020-03-21 DIAGNOSIS — Z23 Encounter for immunization: Secondary | ICD-10-CM | POA: Diagnosis not present

## 2020-03-21 NOTE — Progress Notes (Signed)
Subjective:  Patient ID: Christopher Hendricks, male    DOB: 1946/04/01  Age: 74 y.o. MRN: [id number]  CC:  Chief Complaint  Patient presents with  . Medicare Wellness    pt states he feels well. pt states he has some questions about his herrnia repair. pt reports he still feels a lump in the area of the repair. pt also is conserned that he may have broke the great toe on the pt's R foot. pt states he has to pop the toe every hour  to prevent pain.     HPI Christopher Hendricks presents for Medicare wellness exam and concerns as above  Right inguinal hernia: Repair in June 2020, Dr. Dalbert Batman.  He has felt a lump in the area of his previous repair. Has had persistent small lump.  Did follow up after surgery with surgeon. Told was fluid, and should resolve.  Still some small amt of swelling in area. Occasional sensation in area, min discomfort. No bowel changes. No skin chnages.   Right great toe pain: New sliding glass door.  Jammed foot into threshold a few times barefoot months ago.. No acute swelling/discoloration.  As above, has to pop his toe every few hours. Some soreness after a walk, but able to weight bear.  Tx: none - no meds.   Care team Dermatology Dr. Elvera Lennox General surgery Dr. Dalbert Batman Primary care provider [facility], Dr. Fuller Plan  Cancer screening: Colonoscopy 02/2016 - repeat 5 yrs.  Derm - Dr. Elvera Lennox. Annual exam.   Immunization History  Administered Date(s) Administered  . Fluad Quad(high Dose 65+) 03/21/2020  . Influenza Split 02/24/2012  . Influenza,inj,Quad PF,6+ Mos 03/01/2013, 03/10/2014, 03/01/2015, 03/13/2016, 03/18/2017  . PFIZER SARS-COV-2 Vaccination 05/19/2019, 06/13/2019  . Pneumococcal Conjugate-13 03/01/2015  . Pneumococcal Polysaccharide-23 08/18/2012  . Zoster 09/18/2012     Fall Risk  03/21/2020 04/07/2019 04/07/2019 04/04/2019 09/29/2018  Falls in the past year? 0 0 0 0 0  Number falls in past yr: - 0 0 0 0  Injury with Fall?  - 0 0 0 0  Follow up Falls evaluation completed Falls evaluation completed Falls evaluation completed Falls evaluation completed;Education provided Falls evaluation completed  no loose rugs.  Lighting adequate, cats at home, no clutter.  Stairs at entrance only. Has handrail and pull handle.  Grab bars in bathroom.   Depression screen [facility] 2/9 03/21/2020 04/07/2019 04/04/2019 09/29/2018 09/29/2018  Decreased Interest 0 0 0 0 0  Down, Depressed, Hopeless 0 0 0 0 0  PHQ - 2 Score 0 0 0 0 0   Functional Status Survey: Is the patient deaf or have difficulty hearing?: No Does the patient have difficulty seeing, even when wearing glasses/contacts?: Yes Does the patient have difficulty concentrating, remembering, or making decisions?: No Does the patient have difficulty walking or climbing stairs?: No Does the patient have difficulty dressing or bathing?: No Does the patient have difficulty doing errands alone such as visiting a doctor's office or shopping?: No No exam data present Small print is an issue - wears bifocals. Saw optho - 1-2 years ago.   6CIT Screen 03/21/2020 04/04/2019 03/31/2018 03/18/2017  What Year? 0 points 0 points 0 points 0 points  What month? 0 points 0 points 0 points 0 points  What time? 0 points 0 points 0 points 0 points  Count back from 20 0 points 0 points 0 points 0 points  Months in reverse 0 points 0 points 0 points 0 points  Repeat phrase 0 points  0 points 0 points 2 points  Total Score 0 0 0 2     Office Visit from 03/21/2020 in [facility] Care at [facility]  AUDIT-C Score 5    less alcohol.  Has been cutting back - still trying to cut back. 18-20 drinks per week.   Dental: visit in May with dentist.   Exercise: outside work/yardwork, and walking every day. Min 30 mins.   Results for orders placed or performed in visit on 03/16/20  CMP14+EGFR  Result Value Ref Range   Glucose 74 65 - 99 mg/dL   BUN 13 8 - 27 mg/dL   Creatinine, Ser 0.75 (L) 0.76 - 1.27 mg/dL    GFR calc non Af Amer 91 >59 mL/min/1.73   GFR calc Af Amer 105 >59 mL/min/1.73   BUN/Creatinine Ratio 17 10 - 24   Sodium 141 134 - 144 mmol/L   Potassium 4.5 3.5 - 5.2 mmol/L   Chloride 101 96 - 106 mmol/L   CO2 26 20 - 29 mmol/L   Calcium 9.4 8.6 - 10.2 mg/dL   Total Protein 6.8 6.0 - 8.5 g/dL   Albumin 4.6 3.7 - 4.7 g/dL   Globulin, Total 2.2 1.5 - 4.5 g/dL   Albumin/Globulin Ratio 2.1 1.2 - 2.2   Bilirubin Total 1.4 (H) 0.0 - 1.2 mg/dL   Alkaline Phosphatase 95 44 - 121 IU/L   AST 27 0 - 40 IU/L   ALT 42 0 - 44 IU/L  Lipid panel  Result Value Ref Range   Cholesterol, Total 198 100 - 199 mg/dL   Triglycerides 78 0 - 149 mg/dL   HDL 58 >39 mg/dL   VLDL Cholesterol Cal 14 5 - 40 mg/dL   LDL Chol Calc (NIH) 126 (H) 0 - 99 mg/dL   Chol/HDL Ratio 3.4 0.0 - 5.0 ratio  CBC with Differential/Platelet  Result Value Ref Range   WBC 4.4 3.4 - 10.8 x10E3/uL   RBC 4.44 4.14 - 5.80 x10E6/uL   Hemoglobin 14.4 13.0 - 17.7 g/dL   Hematocrit 43.1 37.5 - 51.0 %   MCV 97 79 - 97 fL   MCH 32.4 26.6 - 33.0 pg   MCHC 33.4 31 - 35 g/dL   RDW 11.5 (L) 11.6 - 15.4 %   Platelets 214 150 - 450 x10E3/uL   Neutrophils 59 Not Estab. %   Lymphs 22 Not Estab. %   Monocytes 16 Not Estab. %   Eos 2 Not Estab. %   Basos 1 Not Estab. %   Neutrophils Absolute 2.6 1.40 - 7.00 x10E3/uL   Lymphocytes Absolute 1.0 0 - 3 x10E3/uL   Monocytes Absolute 0.7 0 - 0 x10E3/uL   EOS (ABSOLUTE) 0.1 0.0 - 0.4 x10E3/uL   Basophils Absolute 0.0 0 - 0 x10E3/uL   Immature Granulocytes 0 Not Estab. %   Immature Grans (Abs) 0.0 0.0 - 0.1 x10E3/uL      History Patient Active Problem List   Diagnosis Date Noted  . Right inguinal hernia 09/17/2018  . Anemia 08/17/2012  . Alcohol abuse 08/17/2012   Past Medical History:  Diagnosis Date  . Alcoholism /alcohol abuse   . Allergy   . Arthritis   . Blood transfusion without reported diagnosis   . Duodenal ulcer   . Heart murmur    as teen  . Hyperlipidemia     Phreesia 03/18/2020  . Neuromuscular disorder ([place])    pinched nerve in neck that causes numbness in fingers on right hand   .  Right inguinal hernia 09/17/2018  . Sigmoid diverticulosis 2007   Past Surgical History:  Procedure Laterality Date  . COLONOSCOPY  2007   sigmoid diverticulosis.  Dr Fuller Plan  . ESOPHAGOGASTRODUODENOSCOPY N/A 08/18/2012   Procedure: ESOPHAGOGASTRODUODENOSCOPY (EGD);  Surgeon: Irene Shipper, MD;  Location: [facility] ENDOSCOPY;  Service: Endoscopy;  Laterality: N/A;  . HERNIA REPAIR     age 4  . INGUINAL HERNIA REPAIR Right 09/17/2018   Procedure: LAPAROSCOPIC REPAIR RIGHT INGUINAL HERNIA;  Surgeon: Fanny Skates, MD;  Location: [place];  Service: General;  Laterality: Right;  . SMALL INTESTINE SURGERY    . TONSILLECTOMY     age 27  . UPPER GASTROINTESTINAL ENDOSCOPY    . VASECTOMY     Allergies  Allergen Reactions  . Codeine Nausea And Vomiting  . Sulfa Antibiotics Rash   Prior to Admission medications   Medication Sig Start Date End Date Taking? Authorizing Provider  Acetaminophen (TYLENOL PO) Take by mouth at bedtime.    Yes [provider]  diphenhydrAMINE (BENADRYL) 25 MG tablet Take 25 mg by mouth at bedtime.    Yes [provider]  Glucosamine-Chondroit-Vit C-Mn (GLUCOSAMINE CHONDR 500 COMPLEX PO) Take 1 tablet by mouth daily.   Yes [provider]  Melatonin 5 MG TABS Take by mouth at bedtime.   Yes [provider]  Multiple Vitamins-Minerals (MULTIVITAMIN PO) Take by mouth daily. Tablet   Yes [provider]  omeprazole (PRILOSEC) 20 MG capsule Take 20 mg by mouth daily.   Yes [provider]  POTASSIUM GLUCONATE PO Take 550 mg by mouth every other day.    Yes [provider]  Zinc 30 MG TABS Take 50 mg by mouth every other day.    Yes [provider]   Social History   Socioeconomic History  . Marital status: Married    Spouse name: Not on file  . Number of children:  1  . Years of education: Not on file  . Highest education level: Master's degree (e.g., MA, MS, MEng, MEd, MSW, MBA)  Occupational History  . Occupation: Engineer, maintenance (IT)  Tobacco Use  . Smoking status: Never Smoker  . Smokeless tobacco: Never Used  Vaping Use  . Vaping Use: Never used  Substance and Sexual Activity  . Alcohol use: Yes    Comment: 3-4 glasses wine a day   . Drug use: Yes    Types: Marijuana    Comment: pt states he takes 1 puff a night.  . Sexual activity: Not Currently    Comment: number of sex partners in the last 84 months  1 Joycelyn Schmid)  Other Topics Concern  . Not on file  Social History Narrative   Exercise walking 2 times a week for 1 mile   Married   Charity fundraiser   Social Determinants of Health   Financial Resource Strain:   . Difficulty of Paying Living Expenses: Not on file  Food Insecurity:   . Worried About Charity fundraiser in the Last Year: Not on file  . Ran Out of Food in the Last Year: Not on file  Transportation Needs:   . Lack of Transportation (Medical): Not on file  . Lack of Transportation (Non-Medical): Not on file  Physical Activity:   . Days of Exercise per Week: Not on file  . Minutes of Exercise per Session: Not on file  Stress:   . Feeling of Stress : Not on file  Social Connections:   .  Frequency of Communication with Friends and Family: Not on file  . Frequency of Social Gatherings with Friends and Family: Not on file  . Attends Religious Services: Not on file  . Active Member of Clubs or Organizations: Not on file  . Attends Archivist Meetings: Not on file  . Marital Status: Not on file  Intimate Partner Violence:   . Fear of Current or Ex-Partner: Not on file  . Emotionally Abused: Not on file  . Physically Abused: Not on file  . Sexually Abused: Not on file    Review of Systems 13 point review of systems per patient health survey noted.  Negative other than as indicated above or in HPI.    Objective:   Vitals:    03/21/20 0939  BP: 129/76  Pulse: 65  Temp: (!) 97.4 F (36.3 C)  TempSrc: Temporal  SpO2: 99%  Weight: 149 lb (67.6 kg)  Height: 6' 1.5" (1.867 m)     Physical Exam Vitals reviewed.  Constitutional:      Appearance: He is well-developed.  HENT:     Head: Normocephalic and atraumatic.     Right Ear: External ear normal.     Left Ear: External ear normal.  Eyes:     Conjunctiva/sclera: Conjunctivae normal.     Pupils: Pupils are equal, round, and reactive to light.  Neck:     Thyroid: No thyromegaly.  Cardiovascular:     Rate and Rhythm: Normal rate and regular rhythm.     Heart sounds: Normal heart sounds.  Pulmonary:     Effort: Pulmonary effort is normal. No respiratory distress.     Breath sounds: Normal breath sounds. No wheezing.  Abdominal:     General: There is no distension.     Palpations: Abdomen is soft.     Tenderness: There is no abdominal tenderness.     Hernia: A hernia is present. Hernia is present in the right inguinal area (questionable hernia vs soft tissue mass/prominence at L groin no change with cough. approx 2cm. ). There is no hernia in the left inguinal area.  Musculoskeletal:        General: No tenderness. Normal range of motion.     Cervical back: Normal range of motion and neck supple.     Comments: Right great toe -appears to have some bony prominence of the MTP with slight discomfort over the dorsal MTP.  No popping appreciated with flexion.  IP joint nontender, neurovascular intact distally.   Lymphadenopathy:     Cervical: No cervical adenopathy.  Skin:    General: Skin is warm and dry.  Neurological:     Mental Status: He is alert and oriented to person, place, and time.     Deep Tendon Reflexes: Reflexes are normal and symmetric.  Psychiatric:        Behavior: Behavior normal.      Assessment & Plan:  Christopher Hendricks is a 74 y.o. male . Medicare annual wellness visit, subsequent  - - anticipatory guidance as below in AVS,  screening labs if needed. Health maintenance items as above in HPI discussed/recommended as applicable.  - no concerning responses on depression, fall, or functional status screening. Any positive responses noted as above. Advanced directives discussed as in CHL.   -Continue to work on decreasing alcohol use.  Plan for repeat bilirubin 3 to 6 months.  Other labs reviewed in office.  Need for vaccination - Plan: Flu Vaccine QUAD High Dose(Fluad)  Right inguinal  hernia  -Fullness appreciated right side, prior hernia repair, no apparent change with Valsalva/cough but will have him follow-up with general surgery to evaluate this area further as prior repair.  referral can be placed if needed.  Great toe pain, right - Plan: DG Toe Great Right  -Possible prior jammed toe versus arthritis.  Check x-ray, then consider podiatry/foot specialist evaluation.  No orders of the defined types were placed in this encounter.  Patient Instructions    I would like you to meet with general surgeon to evaluate the prior hernia repair and symptoms. Let me know if a referral is needed.   I will order xray of toe at [place] and let you know if any concerns. Depending on results, may refer you to foot specialist to look into options to minimize soreness with your walking.   Call your eye specialist for appointment.   Try to cut back on alcohol further - 2 drinks per day at most, then goal of 1-2 drinks per day.   Recheck in 6 months.   Return to the clinic or go to the nearest emergency room if any of your symptoms worsen or new symptoms occur.    Preventive Care 26 Years and Older, Male Preventive care refers to lifestyle choices and visits with your health care provider that can promote health and wellness. This includes:  A yearly physical exam. This is also called an annual well check.  Regular dental and eye exams.  Immunizations.  Screening for certain conditions.  Healthy  lifestyle choices, such as diet and exercise. What can I expect for my preventive care visit? Physical exam Your health care provider will check:  Height and weight. These may be used to calculate body mass index (BMI), which is a measurement that tells if you are at a healthy weight.  Heart rate and blood pressure.  Your skin for abnormal spots. Counseling Your health care provider may ask you questions about:  Alcohol, tobacco, and drug use.  Emotional well-being.  Home and relationship well-being.  Sexual activity.  Eating habits.  History of falls.  Memory and ability to understand (cognition).  Work and work Statistician. What immunizations do I need?  Influenza (flu) vaccine  This is recommended every year. Tetanus, diphtheria, and pertussis (Tdap) vaccine  You may need a Td booster every 10 years. Varicella (chickenpox) vaccine  You may need this vaccine if you have not already been vaccinated. Zoster (shingles) vaccine  You may need this after age 15. Pneumococcal conjugate (PCV13) vaccine  One dose is recommended after age 42. Pneumococcal polysaccharide (PPSV23) vaccine  One dose is recommended after age 27. Measles, mumps, and rubella (MMR) vaccine  You may need at least one dose of MMR if you were born in 1957 or later. You may also need a second dose. Meningococcal conjugate (MenACWY) vaccine  You may need this if you have certain conditions. Hepatitis A vaccine  You may need this if you have certain conditions or if you travel or work in places where you may be exposed to hepatitis A. Hepatitis B vaccine  You may need this if you have certain conditions or if you travel or work in places where you may be exposed to hepatitis B. Haemophilus influenzae type b (Hib) vaccine  You may need this if you have certain conditions. You may receive vaccines as individual doses or as more than one vaccine together in one shot (combination vaccines). Talk  with your health care provider about  the risks and benefits of combination vaccines. What tests do I need? Blood tests  Lipid and cholesterol levels. These may be checked every 5 years, or more frequently depending on your overall health.  Hepatitis C test.  Hepatitis B test. Screening  Lung cancer screening. You may have this screening every year starting at age 88 if you have a 30-pack-year history of smoking and currently smoke or have quit within the past 15 years.  Colorectal cancer screening. All adults should have this screening starting at age 24 and continuing until age 1. Your health care provider may recommend screening at age 60 if you are at increased risk. You will have tests every 1-10 years, depending on your results and the type of screening test.  Prostate cancer screening. Recommendations will vary depending on your family history and other risks.  Diabetes screening. This is done by checking your blood sugar (glucose) after you have not eaten for a while (fasting). You may have this done every 1-3 years.  Abdominal aortic aneurysm (AAA) screening. You may need this if you are a current or former smoker.  Sexually transmitted disease (STD) testing. Follow these instructions at home: Eating and drinking  Eat a diet that includes fresh fruits and vegetables, whole grains, lean protein, and low-fat dairy products. Limit your intake of foods with high amounts of sugar, saturated fats, and salt.  Take vitamin and mineral supplements as recommended by your health care provider.  Do not drink alcohol if your health care provider tells you not to drink.  If you drink alcohol: ? Limit how much you have to 0-2 drinks a day. ? Be aware of how much alcohol is in your drink. In the [place] one drink equals one 12 oz bottle of beer (355 mL), one 5 oz glass of wine (148 mL), or one 1 oz glass of hard liquor (44 mL). Lifestyle  Take daily care of your teeth and gums.  Stay  active. Exercise for at least 30 minutes on 5 or more days each week.  Do not use any products that contain nicotine or tobacco, such as cigarettes, e-cigarettes, and chewing tobacco. If you need help quitting, ask your health care provider.  If you are sexually active, practice safe sex. Use a condom or other form of protection to prevent STIs (sexually transmitted infections).  Talk with your health care provider about taking a low-dose aspirin or statin. What's next?  Visit your health care provider once a year for a well check visit.  Ask your health care provider how often you should have your eyes and teeth checked.  Stay up to date on all vaccines. This information is not intended to replace advice given to you by your health care provider. Make sure you discuss any questions you have with your health care provider. Document Revised: 03/25/2018 Document Reviewed: 03/25/2018 Elsevier Patient Education  [employer].    If you have lab work done today you will be contacted with your lab results within the next 2 weeks.  If you have not heard from [place] then please contact us. The fastest way to get your results is to register for My Chart.   IF you received an x-ray today, you will receive an invoice from [facility]. Please contact [facility] at [phone] with questions or concerns regarding your invoice.   IF you received labwork today, you will receive an invoice from [place]. Please contact [facility] at [phone] with questions or concerns regarding your  invoice.   Our billing staff will not be able to assist you with questions regarding bills from these companies.  You will be contacted with the lab results as soon as they are available. The fastest way to get your results is to activate your My Chart account. Instructions are located on the last page of this paperwork. If you have not heard from [place] regarding the results in 2 weeks, please contact  this office.         Signed, Merri Ray, MD [facility]

## 2020-03-21 NOTE — Patient Instructions (Addendum)
I would like you to meet with general surgeon to evaluate the prior hernia repair and symptoms. Let me know if a referral is needed.   I will order xray of toe at [facility] and let you know if any concerns. Depending on results, may refer you to foot specialist to look into options to minimize soreness with your walking.   Call your eye specialist for appointment.   Try to cut back on alcohol further - 2 drinks per day at most, then goal of 1-2 drinks per day.   Recheck in 6 months.   Return to the clinic or go to the nearest emergency room if any of your symptoms worsen or new symptoms occur.    Preventive Care 20 Years and Older, Male Preventive care refers to lifestyle choices and visits with your health care provider that can promote health and wellness. This includes:  A yearly physical exam. This is also called an annual well check.  Regular dental and eye exams.  Immunizations.  Screening for certain conditions.  Healthy lifestyle choices, such as diet and exercise. What can I expect for my preventive care visit? Physical exam Your health care provider will check:  Height and weight. These may be used to calculate body mass index (BMI), which is a measurement that tells if you are at a healthy weight.  Heart rate and blood pressure.  Your skin for abnormal spots. Counseling Your health care provider may ask you questions about:  Alcohol, tobacco, and drug use.  Emotional well-being.  Home and relationship well-being.  Sexual activity.  Eating habits.  History of falls.  Memory and ability to understand (cognition).  Work and work Statistician. What immunizations do I need?  Influenza (flu) vaccine  This is recommended every year. Tetanus, diphtheria, and pertussis (Tdap) vaccine  You may need a Td booster every 10 years. Varicella (chickenpox) vaccine  You may need this vaccine if you have not already been vaccinated. Zoster (shingles)  vaccine  You may need this after age 54. Pneumococcal conjugate (PCV13) vaccine  One dose is recommended after age 42. Pneumococcal polysaccharide (PPSV23) vaccine  One dose is recommended after age 81. Measles, mumps, and rubella (MMR) vaccine  You may need at least one dose of MMR if you were born in 1957 or later. You may also need a second dose. Meningococcal conjugate (MenACWY) vaccine  You may need this if you have certain conditions. Hepatitis A vaccine  You may need this if you have certain conditions or if you travel or work in places where you may be exposed to hepatitis A. Hepatitis B vaccine  You may need this if you have certain conditions or if you travel or work in places where you may be exposed to hepatitis B. Haemophilus influenzae type b (Hib) vaccine  You may need this if you have certain conditions. You may receive vaccines as individual doses or as more than one vaccine together in one shot (combination vaccines). Talk with your health care provider about the risks and benefits of combination vaccines. What tests do I need? Blood tests  Lipid and cholesterol levels. These may be checked every 5 years, or more frequently depending on your overall health.  Hepatitis C test.  Hepatitis B test. Screening  Lung cancer screening. You may have this screening every year starting at age 31 if you have a 30-pack-year history of smoking and currently smoke or have quit within the past 15 years.  Colorectal cancer screening. All  adults should have this screening starting at age 94 and continuing until age 51. Your health care provider may recommend screening at age 54 if you are at increased risk. You will have tests every 1-10 years, depending on your results and the type of screening test.  Prostate cancer screening. Recommendations will vary depending on your family history and other risks.  Diabetes screening. This is done by checking your blood sugar (glucose)  after you have not eaten for a while (fasting). You may have this done every 1-3 years.  Abdominal aortic aneurysm (AAA) screening. You may need this if you are a current or former smoker.  Sexually transmitted disease (STD) testing. Follow these instructions at home: Eating and drinking  Eat a diet that includes fresh fruits and vegetables, whole grains, lean protein, and low-fat dairy products. Limit your intake of foods with high amounts of sugar, saturated fats, and salt.  Take vitamin and mineral supplements as recommended by your health care provider.  Do not drink alcohol if your health care provider tells you not to drink.  If you drink alcohol: ? Limit how much you have to 0-2 drinks a day. ? Be aware of how much alcohol is in your drink. In the [place] one drink equals one 12 oz bottle of beer (355 mL), one 5 oz glass of wine (148 mL), or one 1 oz glass of hard liquor (44 mL). Lifestyle  Take daily care of your teeth and gums.  Stay active. Exercise for at least 30 minutes on 5 or more days each week.  Do not use any products that contain nicotine or tobacco, such as cigarettes, e-cigarettes, and chewing tobacco. If you need help quitting, ask your health care provider.  If you are sexually active, practice safe sex. Use a condom or other form of protection to prevent STIs (sexually transmitted infections).  Talk with your health care provider about taking a low-dose aspirin or statin. What's next?  Visit your health care provider once a year for a well check visit.  Ask your health care provider how often you should have your eyes and teeth checked.  Stay up to date on all vaccines. This information is not intended to replace advice given to you by your health care provider. Make sure you discuss any questions you have with your health care provider. Document Revised: 03/25/2018 Document Reviewed: 03/25/2018 Elsevier Patient Education  [employer].    If you  have lab work done today you will be contacted with your lab results within the next 2 weeks.  If you have not heard from [place] then please contact us. The fastest way to get your results is to register for My Chart.   IF you received an x-ray today, you will receive an invoice from [facility]. Please contact [facility] at [phone] with questions or concerns regarding your invoice.   IF you received labwork today, you will receive an invoice from [place]. Please contact [facility] at [phone] with questions or concerns regarding your invoice.   Our billing staff will not be able to assist you with questions regarding bills from these companies.  You will be contacted with the lab results as soon as they are available. The fastest way to get your results is to activate your My Chart account. Instructions are located on the last page of this paperwork. If you have not heard from [place] regarding the results in 2 weeks, please contact this office.

## 2020-03-23 ENCOUNTER — Ambulatory Visit
Admission: RE | Admit: 2020-03-23 | Discharge: 2020-03-23 | Disposition: A | Discharge status: Home / Self Care | Payer: PPO | Source: Ambulatory Visit | Attending: Family Medicine | Admitting: Family Medicine

## 2020-03-23 DIAGNOSIS — M79674 Pain in right toe(s): Secondary | ICD-10-CM

## 2020-03-23 DIAGNOSIS — M19071 Primary osteoarthritis, right ankle and foot: Secondary | ICD-10-CM | POA: Diagnosis not present

## 2020-09-19 ENCOUNTER — Other Ambulatory Visit: Payer: Self-pay

## 2020-09-19 ENCOUNTER — Encounter: Payer: Self-pay | Admitting: Family Medicine

## 2020-09-19 ENCOUNTER — Ambulatory Visit (INDEPENDENT_AMBULATORY_CARE_PROVIDER_SITE_OTHER): Payer: PPO | Admitting: Family Medicine

## 2020-09-19 VITALS — BP 112/64 | HR 72 | Temp 98.1°F | Resp 16 | Ht 73.0 in | Wt 148.6 lb

## 2020-09-19 DIAGNOSIS — R17 Unspecified jaundice: Secondary | ICD-10-CM

## 2020-09-19 DIAGNOSIS — E785 Hyperlipidemia, unspecified: Secondary | ICD-10-CM

## 2020-09-19 LAB — COMPREHENSIVE METABOLIC PANEL
ALT: 26 U/L (ref 0–53)
AST: 22 U/L (ref 0–37)
Albumin: 4.6 g/dL (ref 3.5–5.2)
Alkaline Phosphatase: 79 U/L (ref 39–117)
BUN: 10 mg/dL (ref 6–23)
CO2: 32 mEq/L (ref 19–32)
Calcium: 9.5 mg/dL (ref 8.4–10.5)
Chloride: 102 mEq/L (ref 96–112)
Creatinine, Ser: 0.78 mg/dL (ref 0.40–1.50)
GFR: 88.46 mL/min (ref >60.00)
Glucose, Bld: 94 mg/dL (ref 70–99)
Potassium: 4.5 mEq/L (ref 3.5–5.1)
Sodium: 141 mEq/L (ref 135–145)
Total Bilirubin: 1.5 mg/dL — ABNORMAL HIGH (ref 0.2–1.2)
Total Protein: 7.1 g/dL (ref 6.0–8.3)

## 2020-09-19 LAB — LIPID PANEL
Cholesterol: 175 mg/dL (ref 0–200)
HDL: 59.1 mg/dL (ref >39.00)
LDL Cholesterol: 104 mg/dL — ABNORMAL HIGH (ref 0–99)
NonHDL: 115.71
Total CHOL/HDL Ratio: 3
Triglycerides: 59 mg/dL (ref 0.0–149.0)
VLDL: 11.8 mg/dL (ref 0.0–40.0)

## 2020-09-19 NOTE — Progress Notes (Signed)
Subjective:  Patient ID: Christopher Hendricks, male    DOB: September 16, 1945  Age: 75 y.o. MRN: [id number]  CC:  Chief Complaint  Patient presents with  . Hyperlipidemia    Pt here to follow up on cholesterol and concern about elevated Bili level     HPI Christopher Hendricks presents for   Hyperlipidemia: No current statin.  Mild elevated LDL 126 in December.  Of note bilirubin had increased from 1.0-1.4.  LFTs were normal.  We did discuss decreased alcohol use.  Here for repeat testing today.  No new yellowing of eyes/skin. No n/v/abd pain.  Alcohol - 2-3 drinks per day.  walking for exercise - daily, 30 min.   Has appt with surgeon on 6/17 for hernia.   Had pfizer vaccine and 1st booster, deferred 2nd booster for now.   Lab Results  Component Value Date   CHOL 198 03/16/2020   HDL 58 03/16/2020   LDLCALC 126 (H) 03/16/2020   TRIG 78 03/16/2020   CHOLHDL 3.4 03/16/2020   Lab Results  Component Value Date   ALT 42 03/16/2020   AST 27 03/16/2020   ALKPHOS 95 03/16/2020   BILITOT 1.4 (H) 03/16/2020     History Patient Active Problem List   Diagnosis Date Noted  . Right inguinal hernia 09/17/2018  . Anemia 08/17/2012  . Alcohol abuse 08/17/2012   Past Medical History:  Diagnosis Date  . Alcoholism /alcohol abuse   . Allergy   . Arthritis   . Blood transfusion without reported diagnosis   . Duodenal ulcer   . Heart murmur    as teen  . Hyperlipidemia    Phreesia 03/18/2020  . Neuromuscular disorder ([place])    pinched nerve in neck that causes numbness in fingers on right hand   . Right inguinal hernia 09/17/2018  . Sigmoid diverticulosis 2007   Past Surgical History:  Procedure Laterality Date  . COLONOSCOPY  2007   sigmoid diverticulosis.  Dr Fuller Plan  . ESOPHAGOGASTRODUODENOSCOPY N/A 08/18/2012   Procedure: ESOPHAGOGASTRODUODENOSCOPY (EGD);  Surgeon: Irene Shipper, MD;  Location: [facility] ENDOSCOPY;  Service: Endoscopy;  Laterality: N/A;  . HERNIA REPAIR     age 88  .  INGUINAL HERNIA REPAIR Right 09/17/2018   Procedure: LAPAROSCOPIC REPAIR RIGHT INGUINAL HERNIA;  Surgeon: Fanny Skates, MD;  Location: [facility];  Service: General;  Laterality: Right;  . SMALL INTESTINE SURGERY    . TONSILLECTOMY     age 7  . UPPER GASTROINTESTINAL ENDOSCOPY    . VASECTOMY     Allergies  Allergen Reactions  . Codeine Nausea And Vomiting  . Sulfa Antibiotics Rash   Prior to Admission medications   Medication Sig Start Date End Date Taking? Authorizing Provider  Acetaminophen (TYLENOL PO) Take by mouth at bedtime.    Yes [provider]  diphenhydrAMINE (BENADRYL) 25 MG tablet Take 25 mg by mouth at bedtime.    Yes [provider]  Glucosamine-Chondroit-Vit C-Mn (GLUCOSAMINE CHONDR 500 COMPLEX PO) Take 1 tablet by mouth daily.   Yes [provider]  Melatonin 5 MG TABS Take by mouth at bedtime.   Yes [provider]  Multiple Vitamins-Minerals (MULTIVITAMIN PO) Take by mouth daily. Tablet   Yes [provider]  omeprazole (PRILOSEC) 20 MG capsule Take 20 mg by mouth daily.   Yes [provider]  POTASSIUM GLUCONATE PO Take 550 mg by mouth every other day.    Yes [provider]  Zinc 30 MG  TABS Take 50 mg by mouth every other day.    Yes [provider]   Social History   Socioeconomic History  . Marital status: Married    Spouse name: Not on file  . Number of children: 1  . Years of education: Not on file  . Highest education level: Master's degree (e.g., MA, MS, MEng, MEd, MSW, MBA)  Occupational History  . Occupation: Engineer, maintenance (IT)  Tobacco Use  . Smoking status: Never Smoker  . Smokeless tobacco: Never Used  Vaping Use  . Vaping Use: Never used  Substance and Sexual Activity  . Alcohol use: Yes    Comment: 3-4 glasses wine a day   . Drug use: Yes    Types: Marijuana    Comment: pt states he takes 1 puff a night.  . Sexual activity: Not Currently    Comment: number of sex  partners in the last 70 months  1 Joycelyn Schmid)  Other Topics Concern  . Not on file  Social History Narrative   Exercise walking 2 times a week for 1 mile   Married   Charity fundraiser   Social Determinants of Health   Financial Resource Strain: Not on file  Food Insecurity: Not on file  Transportation Needs: Not on file  Physical Activity: Not on file  Stress: Not on file  Social Connections: Not on file  Intimate Partner Violence: Not on file    Review of Systems   Objective:   Vitals:   09/19/20 0811  BP: 112/64  Pulse: 72  Resp: 16  Temp: 98.1 F (36.7 C)  TempSrc: Temporal  SpO2: 100%  Weight: 148 lb 9.6 oz (67.4 kg)  Height: 6\' 1"  (1.854 m)     Physical Exam Vitals reviewed.  Constitutional:      Appearance: He is well-developed.  HENT:     Head: Normocephalic and atraumatic.  Eyes:     Pupils: Pupils are equal, round, and reactive to light.     Comments: No scleral icterus.   Neck:     Vascular: No carotid bruit or JVD.  Cardiovascular:     Rate and Rhythm: Normal rate and regular rhythm.     Heart sounds: Normal heart sounds. No murmur heard.   Pulmonary:     Effort: Pulmonary effort is normal.     Breath sounds: Normal breath sounds. No rales.  Abdominal:     General: There is no distension.     Tenderness: There is no abdominal tenderness. There is no guarding.  Skin:    General: Skin is warm and dry.     Coloration: Skin is not jaundiced.  Neurological:     Mental Status: He is alert and oriented to person, place, and time.  Psychiatric:        Mood and Affect: Mood normal.        Behavior: Behavior normal.        Assessment & Plan:  Christopher Hendricks is a 75 y.o. male . Hyperlipidemia, unspecified hyperlipidemia type - Plan: Comprehensive metabolic panel, Lipid panel  Elevated bilirubin - Plan: Comprehensive metabolic panel  - repeat labs, no med changes at this time.   Recommended 2nd covid vaccine booster.   No orders of the  defined types were placed in this encounter.  Patient Instructions  Thanks for coming in today.  I do recommend second COVID vaccine booster sooner than later.  I will recheck cholesterol and bilirubin today.  If any questions or concerns,  please let me know.  Take care!     Signed, Merri Ray, MD [facility]

## 2020-09-19 NOTE — Patient Instructions (Signed)
Thanks for coming in today.  I do recommend second COVID vaccine booster sooner than later.  I will recheck cholesterol and bilirubin today.  If any questions or concerns, please let me know.  Take care!

## 2020-09-28 DIAGNOSIS — R1031 Right lower quadrant pain: Secondary | ICD-10-CM | POA: Diagnosis not present

## 2020-11-01 DIAGNOSIS — D225 Melanocytic nevi of trunk: Secondary | ICD-10-CM | POA: Diagnosis not present

## 2020-11-01 DIAGNOSIS — L300 Nummular dermatitis: Secondary | ICD-10-CM | POA: Diagnosis not present

## 2020-11-01 DIAGNOSIS — L821 Other seborrheic keratosis: Secondary | ICD-10-CM | POA: Diagnosis not present

## 2020-11-01 DIAGNOSIS — L814 Other melanin hyperpigmentation: Secondary | ICD-10-CM | POA: Diagnosis not present

## 2020-11-01 DIAGNOSIS — L570 Actinic keratosis: Secondary | ICD-10-CM | POA: Diagnosis not present

## 2021-03-22 ENCOUNTER — Encounter: Payer: PPO | Admitting: Family Medicine

## 2021-04-03 ENCOUNTER — Encounter: Payer: Self-pay | Admitting: Registered Nurse

## 2021-04-03 ENCOUNTER — Ambulatory Visit (INDEPENDENT_AMBULATORY_CARE_PROVIDER_SITE_OTHER): Payer: PPO | Admitting: Registered Nurse

## 2021-04-03 ENCOUNTER — Encounter: Payer: Self-pay | Admitting: Gastroenterology

## 2021-04-03 VITALS — BP 118/66 | HR 60 | Temp 98.2°F | Resp 18 | Ht 73.0 in | Wt 147.4 lb

## 2021-04-03 DIAGNOSIS — Z0000 Encounter for general adult medical examination without abnormal findings: Secondary | ICD-10-CM | POA: Diagnosis not present

## 2021-04-03 DIAGNOSIS — E785 Hyperlipidemia, unspecified: Secondary | ICD-10-CM

## 2021-04-03 DIAGNOSIS — D649 Anemia, unspecified: Secondary | ICD-10-CM | POA: Diagnosis not present

## 2021-04-03 DIAGNOSIS — Z1211 Encounter for screening for malignant neoplasm of colon: Secondary | ICD-10-CM

## 2021-04-03 DIAGNOSIS — R17 Unspecified jaundice: Secondary | ICD-10-CM

## 2021-04-03 LAB — CBC WITH DIFFERENTIAL/PLATELET
Basophils Absolute: 0 10*3/uL (ref 0.0–0.1)
Basophils Relative: 1.1 % (ref 0.0–3.0)
Eosinophils Absolute: 0 10*3/uL (ref 0.0–0.7)
Eosinophils Relative: 1.1 % (ref 0.0–5.0)
HCT: 44 % (ref 39.0–52.0)
Hemoglobin: 14.8 g/dL (ref 13.0–17.0)
Lymphocytes Relative: 17.5 % (ref 12.0–46.0)
Lymphs Abs: 0.6 10*3/uL — ABNORMAL LOW (ref 0.7–4.0)
MCHC: 33.5 g/dL (ref 30.0–36.0)
MCV: 95.8 fl (ref 78.0–100.0)
Monocytes Absolute: 0.5 10*3/uL (ref 0.1–1.0)
Monocytes Relative: 13.4 % — ABNORMAL HIGH (ref 3.0–12.0)
Neutro Abs: 2.4 10*3/uL (ref 1.4–7.7)
Neutrophils Relative %: 66.9 % (ref 43.0–77.0)
Platelets: 202 10*3/uL (ref 150.0–400.0)
RBC: 4.59 Mil/uL (ref 4.22–5.81)
RDW: 12.6 % (ref 11.5–15.5)
WBC: 3.6 10*3/uL — ABNORMAL LOW (ref 4.0–10.5)

## 2021-04-03 LAB — COMPREHENSIVE METABOLIC PANEL
ALT: 33 U/L (ref 0–53)
AST: 30 U/L (ref 0–37)
Albumin: 4.5 g/dL (ref 3.5–5.2)
Alkaline Phosphatase: 75 U/L (ref 39–117)
BUN: 11 mg/dL (ref 6–23)
CO2: 34 mEq/L — ABNORMAL HIGH (ref 19–32)
Calcium: 9.6 mg/dL (ref 8.4–10.5)
Chloride: 100 mEq/L (ref 96–112)
Creatinine, Ser: 0.85 mg/dL (ref 0.40–1.50)
GFR: 85.86 mL/min (ref >60.00)
Glucose, Bld: 75 mg/dL (ref 70–99)
Potassium: 4.6 mEq/L (ref 3.5–5.1)
Sodium: 140 mEq/L (ref 135–145)
Total Bilirubin: 1.5 mg/dL — ABNORMAL HIGH (ref 0.2–1.2)
Total Protein: 7 g/dL (ref 6.0–8.3)

## 2021-04-03 LAB — LIPID PANEL
Cholesterol: 180 mg/dL (ref 0–200)
HDL: 57.1 mg/dL (ref >39.00)
LDL Cholesterol: 107 mg/dL — ABNORMAL HIGH (ref 0–99)
NonHDL: 123.08
Total CHOL/HDL Ratio: 3
Triglycerides: 78 mg/dL (ref 0.0–149.0)
VLDL: 15.6 mg/dL (ref 0.0–40.0)

## 2021-04-03 NOTE — Progress Notes (Signed)
Subjective:  Patient ID: Christopher Hendricks, male    DOB: [birth year]  Age: 75 y.o. MRN: [id number]  CC:  Chief Complaint  Patient presents with   Medicare Wellness    Patient states he is here for his wellness exam. 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  No ongoing symptoms.   Care team Dermatology Dr. Elvera Lennox General surgery Dr. Dalbert Batman Primary care provider [facility], Dr. Fuller Plan  Cancer screening: Colonoscopy 02/2016 - due for repeat. Will send referral.  Derm - Dr. Elvera Lennox. Annual exam.   Immunization History  Administered Date(s) Administered   Fluad Quad(high Dose 65+) 03/21/2020   Influenza Split 02/24/2012   Influenza,inj,Quad PF,6+ Mos 03/01/2013, 03/10/2014, 03/01/2015, 03/13/2016, 03/18/2017   Influenza-Unspecified 02/16/2019   PFIZER(Purple Top)SARS-COV-2 Vaccination 05/19/2019, 06/13/2019, 03/02/2020   Pneumococcal Conjugate-13 03/01/2015   Pneumococcal Polysaccharide-23 08/18/2012, 03/16/2019   Zoster, Live 09/18/2012   Zoster, Unspecified 02/22/2013     Fall Risk  04/03/2021 09/19/2020 03/21/2020 04/07/2019 04/07/2019  Falls in the past year? 0 0 0 0 0  Number falls in past yr: 0 - - 0 0  Injury with Fall? 0 - - 0 0  Risk for fall due to : No Fall Risks - - - -  Follow up Falls evaluation completed Falls evaluation completed Falls evaluation completed Falls evaluation completed Falls evaluation completed  no loose rugs.  Lighting adequate, cats at home, no clutter.  Stairs at entrance only. Has handrail and pull handle.  Grab bars in bathroom.   Depression screen [facility] 2/9 04/03/2021 09/19/2020 03/21/2020 04/07/2019  04/04/2019  Decreased Interest 0 0 0 0 0  Down, Depressed, Hopeless 0 0 0 0 0  PHQ - 2 Score 0 0 0 0 0  Altered sleeping 1 0 - - -  Tired, decreased energy 0 0 - - -  Change in appetite 0 0 - - -  Feeling bad or failure about yourself  0 0 - - -  Trouble concentrating 0 0 - - -  Moving slowly or fidgety/restless 0 0 - - -  Suicidal thoughts 0 0 - - -  PHQ-9 Score 1 0 - - -  Difficult doing work/chores Not difficult at all - - - -   Functional Status Survey: Is the patient deaf or have difficulty hearing?: No Does the patient have difficulty seeing, even when wearing glasses/contacts?: No Does the patient have difficulty concentrating, remembering, or making decisions?: No Does the patient have difficulty walking or climbing stairs?: No Does the patient have difficulty dressing or bathing?: No Does the patient have difficulty doing errands alone such as visiting a doctor's office or shopping?: No No results found. Small print is an issue - wears bifocals. Saw optho - 1-2 years ago.   6CIT Screen 04/03/2021 03/21/2020 04/04/2019 03/31/2018 03/18/2017  What Year? 0 points 0 points 0 points 0 points 0 points  What month? 0 points 0 points 0 points 0 points 0 points  What time? 0 points 0 points 0 points 0 points 0 points  Count back from 20 0 points 0 points 0 points 0 points 0 points  Months in reverse 0 points 0 points 0 points 0 points 0  points  Repeat phrase 0 points 0 points 0 points 0 points 2 points  Total Score 0 0 0 0 2   [place] Visit from 03/21/2020 in Primary Care at [facility]  AUDIT-C Score 5     less alcohol.  Has been cutting back - still trying to cut back. 18-20 drinks per week. Mostly wine. Aware of effect on labs.  Dental: visit within past 6 mo  Exercise: outside work/yardwork, and walking every day. [place]   Results for orders placed or performed in visit on 09/19/20  Comprehensive metabolic panel  Result Value Ref Range   Sodium  141 135 - 145 mEq/L   Potassium 4.5 3.5 - 5.1 mEq/L   Chloride 102 96 - 112 mEq/L   CO2 32 19 - 32 mEq/L   Glucose, Bld 94 70 - 99 mg/dL   BUN 10 6 - 23 mg/dL   Creatinine, Ser 0.78 0.40 - 1.50 mg/dL   Total Bilirubin 1.5 (H) 0.2 - 1.2 mg/dL   Alkaline Phosphatase 79 39 - 117 U/L   AST 22 0 - 37 U/L   ALT 26 0 - 53 U/L   Total Protein 7.1 6.0 - 8.3 g/dL   Albumin 4.6 3.5 - 5.2 g/dL   GFR 88.46 >60.00 mL/min   Calcium 9.5 8.4 - 10.5 mg/dL  Lipid panel  Result Value Ref Range   Cholesterol 175 0 - 200 mg/dL   Triglycerides 59.0 0.0 - 149.0 mg/dL   HDL 59.10 >39.00 mg/dL   VLDL 11.8 0.0 - 40.0 mg/dL   LDL Cholesterol 104 (H) 0 - 99 mg/dL   Total CHOL/HDL Ratio 3    NonHDL 115.71       History Patient Active Problem List   Diagnosis Date Noted   Right inguinal hernia 09/17/2018   Anemia 08/17/2012   Alcohol abuse 08/17/2012   Past Medical History:  Diagnosis Date   Alcoholism /alcohol abuse    Allergy    Arthritis    Blood transfusion without reported diagnosis    Duodenal ulcer    Heart murmur    as teen   Hyperlipidemia    Phreesia 03/18/2020   Neuromuscular disorder (HCC)    pinched nerve in neck that causes numbness in fingers on right hand    Right inguinal hernia 09/17/2018   Sigmoid diverticulosis 2007   Past Surgical History:  Procedure Laterality Date   COLONOSCOPY  2007   sigmoid diverticulosis.  Dr Fuller Plan   ESOPHAGOGASTRODUODENOSCOPY N/A 08/18/2012   Procedure: ESOPHAGOGASTRODUODENOSCOPY (EGD);  Surgeon: Irene Shipper, MD;  Location: [facility] ENDOSCOPY;  Service: Endoscopy;  Laterality: N/A;   HERNIA REPAIR     age 49   INGUINAL HERNIA REPAIR Right 09/17/2018   Procedure: LAPAROSCOPIC REPAIR RIGHT INGUINAL HERNIA;  Surgeon: Fanny Skates, MD;  Location: [place];  Service: General;  Laterality: Right;   SMALL INTESTINE SURGERY     TONSILLECTOMY     age 55   UPPER GASTROINTESTINAL ENDOSCOPY     VASECTOMY     Allergies  Allergen Reactions    Codeine Nausea And Vomiting   Sulfa Antibiotics Rash   Prior to Admission medications   Medication Sig Start Date End Date Taking? Authorizing Provider  Acetaminophen (TYLENOL PO) Take by mouth at bedtime.    Yes [provider]  diphenhydrAMINE (BENADRYL) 25 MG tablet Take 25 mg by mouth at bedtime.    Yes [provider]  Glucosamine-Chondroit-Vit C-Mn (GLUCOSAMINE CHONDR  500 COMPLEX PO) Take 1 tablet by mouth daily.   Yes [provider]  Melatonin 5 MG TABS Take by mouth at bedtime.   Yes [provider]  Multiple Vitamins-Minerals (MULTIVITAMIN PO) Take by mouth daily. Tablet   Yes [provider]  omeprazole (PRILOSEC) 20 MG capsule Take 20 mg by mouth daily.   Yes [provider]  POTASSIUM GLUCONATE PO Take 550 mg by mouth every other day.    Yes [provider]  Zinc 30 MG TABS Take 50 mg by mouth every other day.    Yes [provider]   Social History   Socioeconomic History   Marital status: Married    Spouse name: Not on file   Number of children: 1   Years of education: Not on file   Highest education level: Master's degree (e.g., MA, MS, MEng, MEd, MSW, MBA)  Occupational History   Occupation: Engineer, maintenance (IT)  Tobacco Use   Smoking status: Never   Smokeless tobacco: Never  Vaping Use   Vaping Use: Never used  Substance and Sexual Activity   Alcohol use: Yes    Comment: 3-4 glasses wine a day    Drug use: Yes    Types: Marijuana    Comment: pt states he takes 1 puff a night.   Sexual activity: Not Currently    Comment: number of sex partners in the last 33 months  1 Joycelyn Schmid)  Other Topics Concern   Not on file  Social History Narrative   Exercise walking 2 times a week for 1 mile   Married   Charity fundraiser   Social Determinants of Health   Financial Resource Strain: Not on file  Food Insecurity: Not on file  Transportation Needs: Not on file  Physical Activity: Not on file  Stress: Not on file   Social Connections: Not on file  Intimate Partner Violence: Not on file    Review of Systems Per hpi, otherwise negative.   Objective:   Vitals:   04/03/21 0820  BP: 118/66  Pulse: 60  Resp: 18  Temp: 98.2 F (36.8 C)  TempSrc: Temporal  SpO2: 99%  Weight: 147 lb 6.4 oz (66.9 kg)  Height: 6\' 1"  (1.854 m)     Physical Exam Vitals reviewed.  Constitutional:      General: He is not in acute distress.    Appearance: Normal appearance. He is well-developed and normal weight. He is not ill-appearing, toxic-appearing or diaphoretic.  HENT:     Head: Normocephalic and atraumatic.     Right Ear: External ear normal.     Left Ear: External ear normal.  Eyes:     Conjunctiva/sclera: Conjunctivae normal.     Pupils: Pupils are equal, round, and reactive to light.  Neck:     Thyroid: No thyromegaly.  Cardiovascular:     Rate and Rhythm: Normal rate and regular rhythm.     Heart sounds: Normal heart sounds.  Pulmonary:     Effort: Pulmonary effort is normal. No respiratory distress.     Breath sounds: Normal breath sounds. No wheezing.  Abdominal:     General: There is no distension.     Palpations: Abdomen is soft.     Tenderness: There is no abdominal tenderness.     Hernia: There is no hernia in the left inguinal area. Right inguinal: questionable hernia vs soft tissue mass/prominence at L groin no change with cough. approx 2cm. . Musculoskeletal:  General: No tenderness. Normal range of motion.     Cervical back: Normal range of motion and neck supple.     Comments: Right great toe -appears to have some bony prominence of the MTP with slight discomfort over the dorsal MTP.  No popping appreciated with flexion.  IP joint nontender, neurovascular intact distally.   Lymphadenopathy:     Cervical: No cervical adenopathy.  Skin:    General: Skin is warm and dry.  Neurological:     Mental Status: He is alert and oriented to person, place, and time.     Deep Tendon  Reflexes: Reflexes are normal and symmetric.  Psychiatric:        Behavior: Behavior normal.     Assessment & Plan:  Christopher Hendricks is a 75 y.o. male . Medicare annual wellness visit, subsequent  - - anticipatory guidance as below in AVS, screening labs ordered  Reviewed health maintenance.  Return in 6 mo for hld  Advise monitoring of finger pain. Ok to use diclofenac topical sparingly on small joints. Likely reflection of OA or ddd in c spine. No further work up warranted at this time.   No orders of the defined types were placed in this encounter.  Patient Instructions      If you have lab work done today you will be contacted with your lab results within the next 2 weeks.  If you have not heard from [place] then please contact us. The fastest way to get your results is to register for My Chart.   IF you received an x-ray today, you will receive an invoice from [facility]. Please contact [facility] at [phone] with questions or concerns regarding your invoice.   IF you received labwork today, you will receive an invoice from [facility]. Please contact [facility] at [phone] with questions or concerns regarding your invoice.   Our billing staff will not be able to assist you with questions regarding bills from these companies.  You will be contacted with the lab results as soon as they are available. The fastest way to get your results is to activate your My Chart account. Instructions are located on the last page of this paperwork. If you have not heard from [place] regarding the results in 2 weeks, please contact this office.        Signed, Kathrin Ruddy, NP

## 2021-04-03 NOTE — Patient Instructions (Addendum)
Mr. Strine -  Doristine Devoid to see you. Glad your health is stable.  Labs will be back this afternoon. I'll call with any urgent concerns.  See you in 6 mo, sooner if you need anything  Thank you  Rich     If you have lab work done today you will be contacted with your lab results within the next 2 weeks.  If you have not heard from [place] then please contact us. The fastest way to get your results is to register for My Chart.   IF you received an x-ray today, you will receive an invoice from [facility]. Please contact [facility] at [phone] with questions or concerns regarding your invoice.   IF you received labwork today, you will receive an invoice from [facility]. Please contact [facility] at [phone] with questions or concerns regarding your invoice.   Our billing staff will not be able to assist you with questions regarding bills from these companies.  You will be contacted with the lab results as soon as they are available. The fastest way to get your results is to activate your My Chart account. Instructions are located on the last page of this paperwork. If you have not heard from [place] regarding the results in 2 weeks, please contact this office.

## 2021-05-07 DIAGNOSIS — L259 Unspecified contact dermatitis, unspecified cause: Secondary | ICD-10-CM | POA: Diagnosis not present

## 2021-05-07 DIAGNOSIS — K219 Gastro-esophageal reflux disease without esophagitis: Secondary | ICD-10-CM | POA: Diagnosis not present

## 2021-05-07 DIAGNOSIS — M5136 Other intervertebral disc degeneration, lumbar region: Secondary | ICD-10-CM | POA: Diagnosis not present

## 2021-05-07 DIAGNOSIS — M1990 Unspecified osteoarthritis, unspecified site: Secondary | ICD-10-CM | POA: Diagnosis not present

## 2021-07-05 ENCOUNTER — Encounter: Payer: Self-pay | Admitting: Gastroenterology

## 2021-07-31 ENCOUNTER — Other Ambulatory Visit: Payer: Self-pay

## 2021-07-31 ENCOUNTER — Ambulatory Visit (AMBULATORY_SURGERY_CENTER): Payer: PPO | Admitting: *Deleted

## 2021-07-31 VITALS — Ht 71.5 in | Wt 148.0 lb

## 2021-07-31 DIAGNOSIS — Z86010 Personal history of colonic polyps: Secondary | ICD-10-CM

## 2021-07-31 MED ORDER — PEG 3350-KCL-NA BICARB-NACL 420 G PO SOLR: 4000.0000 mL | Freq: Once | ORAL | 0 refills | Status: AC

## 2021-07-31 NOTE — Progress Notes (Signed)
Pt's previsit is done over the phone and all paperwork (prep instructions, blank consent form to just read over) sent to patient.  Insurance hasn't changed per pt.Pt's name and DOB verified at the beginning of the previsit.  Pt denies any difficulty with ambulating.  ? ? ? ?No trouble with anesthesia, denies being told they were difficult to intubate, or hx/fam hx of malignant hyperthermia per pt ? ? ?No egg or soy allergy ? ?No home oxygen use  ? ?No medications for weight loss taken ? ?Pt denies constipation issues ? ?Pt informed that we do not do prior authorizations for prep ? ? ?

## 2021-08-05 ENCOUNTER — Encounter: Payer: Self-pay | Admitting: Gastroenterology

## 2021-08-13 ENCOUNTER — Ambulatory Visit (AMBULATORY_SURGERY_CENTER): Payer: PPO | Admitting: Gastroenterology

## 2021-08-13 ENCOUNTER — Encounter: Payer: Self-pay | Admitting: Gastroenterology

## 2021-08-13 VITALS — BP 133/74 | HR 56 | Temp 97.1°F | Resp 10 | Ht 71.5 in | Wt 148.0 lb

## 2021-08-13 DIAGNOSIS — D120 Benign neoplasm of cecum: Secondary | ICD-10-CM

## 2021-08-13 DIAGNOSIS — D124 Benign neoplasm of descending colon: Secondary | ICD-10-CM | POA: Diagnosis not present

## 2021-08-13 DIAGNOSIS — D123 Benign neoplasm of transverse colon: Secondary | ICD-10-CM

## 2021-08-13 DIAGNOSIS — Z86010 Personal history of colonic polyps: Secondary | ICD-10-CM | POA: Diagnosis not present

## 2021-08-13 MED ORDER — SODIUM CHLORIDE 0.9 % IV SOLN: 500.0000 mL | INTRAVENOUS | Status: AC

## 2021-08-13 NOTE — Op Note (Signed)
[place] ?Patient Name: Christopher Hendricks ?Procedure Date: 08/13/2021 3:34 PM ?MRN: [id number] ?Endoscopist: Ladene Artist , MD ?Age: 76 ?Referring MD:  ?Date of Birth: 1945/08/07 ?Gender: Male ?Account #: [id number] ?Procedure:                Colonoscopy ?Indications:              Surveillance: Personal history of adenomatous  ?                          polyps on last colonoscopy > 5 years ago ?Medicines:                Monitored Anesthesia Care ?Procedure:                Pre-Anesthesia Assessment: ?                          - Prior to the procedure, a History and Physical  ?                          was performed, and patient medications and  ?                          allergies were reviewed. The patient's tolerance of  ?                          previous anesthesia was also reviewed. The risks  ?                          and benefits of the procedure and the sedation  ?                          options and risks were discussed with the patient.  ?                          All questions were answered, and informed consent  ?                          was obtained. Prior Anticoagulants: The patient has  ?                          taken no previous anticoagulant or antiplatelet  ?                          agents. ASA Grade Assessment: II - A patient with  ?                          mild systemic disease. After reviewing the risks  ?                          and benefits, the patient was deemed in  ?                          satisfactory condition to undergo the procedure. ?  After obtaining informed consent, the colonoscope  ?                          was passed under direct vision. Throughout the  ?                          procedure, the patient's blood pressure, pulse, and  ?                          oxygen saturations were monitored continuously. The  ?                          PCF-HQ190L Colonoscope was introduced through the  ?                          anus and advanced to  the the cecum, identified by  ?                          appendiceal orifice and ileocecal valve. The  ?                          ileocecal valve, appendiceal orifice, and rectum  ?                          were photographed. The quality of the bowel  ?                          preparation was good. The colonoscopy was performed  ?                          without difficulty. The patient tolerated the  ?                          procedure well. ?Scope In: 3:47:36 PM ?Scope Out: 4:11:59 PM ?Scope Withdrawal Time: 0 hours 20 minutes 57 seconds  ?Total Procedure Duration: 0 hours 24 minutes 23 seconds  ?Findings:                 The perianal and digital rectal examinations were  ?                          normal. ?                          A 18 mm polyp was found in the ileocecal valve. The  ?                          polyp was sessile. The polyp was removed with a  ?                          piecemeal technique using a cold snare. Resection  ?                          and retrieval were complete. ?  A 3 mm polyp was found in the transverse colon. The  ?                          polyp was sessile. The polyp was removed with a  ?                          cold biopsy forceps. Resection and retrieval were  ?                          complete. ?                          A 7 mm polyp was found in the descending colon. The  ?                          polyp was sessile. The polyp was removed with a  ?                          cold snare. Resection and retrieval were complete. ?                          A single small localized angiodysplastic lesion  ?                          without bleeding was found in the cecum. ?                          A few small-mouthed diverticula were found in the  ?                          left colon. There was no evidence of diverticular  ?                          bleeding. ?                          Internal hemorrhoids were found during  ?                           retroflexion. The hemorrhoids were moderate and  ?                          Grade I (internal hemorrhoids that do not prolapse). ?                          The exam was otherwise without abnormality on  ?                          direct and retroflexion views. ?Complications:            No immediate complications. Estimated blood loss:  ?                          None. ?Estimated Blood Loss:     Estimated blood loss: none. ?  Impression:               - One 18 mm polyp at the ileocecal valve, removed  ?                          piecemeal using a cold snare. Resected and  ?                          retrieved. ?                          - One 3 mm polyp in the transverse colon, removed  ?                          with a cold biopsy forceps. Resected and retrieved. ?                          - One 7 mm polyp in the descending colon, removed  ?                          with a cold snare. Resected and retrieved. ?                          - Cecal AVM. ?                          - Mild diverticulosis in the left colon. ?                          - Internal hemorrhoids. ?                          - The examination was otherwise normal on direct  ?                          and retroflexion views. ?Recommendation:           - Repeat colonoscopy after studies are complete for  ?                          surveillance based on pathology results. ?                          - Patient has a contact number available for  ?                          emergencies. The signs and symptoms of potential  ?                          delayed complications were discussed with the  ?                          patient. Return to normal activities tomorrow.  ?                          Written discharge instructions were provided  to the  ?                          patient. ?                          - Resume previous diet. ?                          - Continue present medications. ?                          - Await pathology results. ?                           - No aspirin, ibuprofen, naproxen, or other  ?                          non-steroidal anti-inflammatory drugs for 2 weeks  ?                          after polyp removal. ?Ladene Artist, MD ?08/13/2021 4:17:54 PM ?This report has been signed electronically. ?

## 2021-08-13 NOTE — Progress Notes (Signed)
Called to room to assist during endoscopic procedure.  Patient ID and intended procedure confirmed with present staff. Received instructions for my participation in the procedure from the performing physician.  

## 2021-08-13 NOTE — Progress Notes (Signed)
Pt non-responsive, VVS, Report to RN  °

## 2021-08-13 NOTE — Progress Notes (Signed)
Pt's states no medical or surgical changes since previsit or office visit. 

## 2021-08-13 NOTE — Patient Instructions (Addendum)
Handouts were given to your care partner on polyps, diverticulosis, hemorrhoids and a high fiber diet with liberal fluid intake. ?NO ASPIRIN, ASPIRIN CONTAINING PRODUCTS (BC OR GOODY POWDERS) OR NSAIDS (IBUPROFEN, ADVIL, ALEVE, AND MOTRIN) FOR 2 weeks; TYLENOL IS OK TO TAKE if needed. ?You may resume your other current medications today. ?Await biopsy results.  May take 1-3 weeks to receive pathology results. ?Please call if any questions or concerns. ?  ? ? ?YOU HAD AN ENDOSCOPIC PROCEDURE TODAY AT [facility]:   Refer to the procedure report that was given to you for any specific questions about what was found during the examination.  If the procedure report does not answer your questions, please call your gastroenterologist to clarify.  If you requested that your care partner not be given the details of your procedure findings, then the procedure report has been included in a sealed envelope for you to review at your convenience later. ? ?YOU SHOULD EXPECT: Some feelings of bloating in the abdomen. Passage of more gas than usual.  Walking can help get rid of the air that was put into your GI tract during the procedure and reduce the bloating. If you had a lower endoscopy (such as a colonoscopy or flexible sigmoidoscopy) you may notice spotting of blood in your stool or on the toilet paper. If you underwent a bowel prep for your procedure, you may not have a normal bowel movement for a few days. ? ?Please Note:  You might notice some irritation and congestion in your nose or some drainage.  This is from the oxygen used during your procedure.  There is no need for concern and it should clear up in a day or so. ? ?SYMPTOMS TO REPORT IMMEDIATELY: ? ?Following lower endoscopy (colonoscopy or flexible sigmoidoscopy): ? Excessive amounts of blood in the stool ? Significant tenderness or worsening of abdominal pains ? Swelling of the abdomen that is new, acute ? Fever of 100?F or higher ? ? ?For urgent  or emergent issues, a gastroenterologist can be reached at any hour by calling [phone]. ?Do not use MyChart messaging for urgent concerns.  ? ? ?DIET:  We do recommend a small meal at first, but then you may proceed to your regular diet.  Drink plenty of fluids but you should avoid alcoholic beverages for 24 hours. ? ?ACTIVITY:  You should plan to take it easy for the rest of today and you should NOT DRIVE or use heavy machinery until tomorrow (because of the sedation medicines used during the test).   ? ?FOLLOW UP: ?Our staff will call the number listed on your records 48-72 hours following your procedure to check on you and address any questions or concerns that you may have regarding the information given to you following your procedure. If we do not reach you, we will leave a message.  We will attempt to reach you two times.  During this call, we will ask if you have developed any symptoms of COVID 19. If you develop any symptoms (ie: fever, flu-like symptoms, shortness of breath, cough etc.) before then, please call [phone].  If you test positive for Covid 19 in the 2 weeks post procedure, please call and report this information to [place].   ? ?If any biopsies were taken you will be contacted by phone or by letter within the next 1-3 weeks.  Please call us at [phone] if you have not heard about the biopsies in 3 weeks.  ? ? ?  SIGNATURES/CONFIDENTIALITY: ?You and/or your care partner have signed paperwork which will be entered into your electronic medical record.  These signatures attest to the fact that that the information above on your After Visit Summary has been reviewed and is understood.  Full responsibility of the confidentiality of this discharge information lies with you and/or your care-partner.  ?

## 2021-08-13 NOTE — Progress Notes (Signed)
? ?History & Physical ? ?Primary Care Physician:  [name], MD ?Primary Gastroenterologist: Lucio Edward, MD ? ?CHIEF COMPLAINT:  Personal history of colon polyps  ? ?HPI: Christopher Hendricks is a 76 y.o. male with a personal history of 4 adenomatous colon polyps on last colonoscopy here for surveillance colonoscopy. ? ? ?Past Medical History:  ?Diagnosis Date  ? Alcoholism /alcohol abuse   ? Allergy   ? Arthritis   ? Blood transfusion without reported diagnosis   ? Duodenal ulcer   ? GERD (gastroesophageal reflux disease)   ? Heart murmur   ? as teen  ? Hyperlipidemia   ? Phreesia 03/18/2020  ? Neuromuscular disorder ([place])   ? pinched nerve in neck that causes numbness in fingers on right hand   ? Right inguinal hernia 09/17/2018  ? Sigmoid diverticulosis 2007  ? ? ?Past Surgical History:  ?Procedure Laterality Date  ? COLONOSCOPY  2007  ? sigmoid diverticulosis.  Dr Fuller Plan  ? ESOPHAGOGASTRODUODENOSCOPY N/A 08/18/2012  ? Procedure: ESOPHAGOGASTRODUODENOSCOPY (EGD);  Surgeon: Irene Shipper, MD;  Location: [facility]COPY;  Service: Endoscopy;  Laterality: N/A;  ? HERNIA REPAIR    ? age 67  ? INGUINAL HERNIA REPAIR Right 09/17/2018  ? Procedure: LAPAROSCOPIC REPAIR RIGHT INGUINAL HERNIA;  Surgeon: Fanny Skates, MD;  Location: [place];  Service: General;  Laterality: Right;  ? SMALL INTESTINE SURGERY    ? TONSILLECTOMY    ? age 24  ? UPPER GASTROINTESTINAL ENDOSCOPY    ? VASECTOMY    ? ? ?Prior to Admission medications   ?Medication Sig Start Date End Date Taking? Authorizing Provider  ?Acetaminophen (TYLENOL PO) Take by mouth at bedtime. '250mg'$    Yes [provider]  ?diphenhydrAMINE (BENADRYL) 25 MG tablet Take 25 mg by mouth at bedtime.    Yes [provider]  ?Glucosamine-Chondroit-Vit C-Mn (GLUCOSAMINE CHONDR 500 COMPLEX PO) Take 1 tablet by mouth daily.   Yes [provider]  ?Melatonin 5 MG TABS Take by mouth at bedtime.   Yes [provider]  ?Multiple  Vitamins-Minerals (MULTIVITAMIN PO) Take by mouth daily. Tablet ?Preservision   Yes [provider]  ?omeprazole (PRILOSEC) 20 MG capsule Take 20 mg by mouth daily.   Yes [provider]  ?POTASSIUM GLUCONATE PO Take 550 mg by mouth every other day.    Yes [provider]  ?Zinc 30 MG TABS Take 50 mg by mouth every other day.    Yes [provider]  ? ? ?Current Outpatient Medications  ?Medication Sig Dispense Refill  ? Acetaminophen (TYLENOL PO) Take by mouth at bedtime. '250mg'$     ? diphenhydrAMINE (BENADRYL) 25 MG tablet Take 25 mg by mouth at bedtime.     ? Glucosamine-Chondroit-Vit C-Mn (GLUCOSAMINE CHONDR 500 COMPLEX PO) Take 1 tablet by mouth daily.    ? Melatonin 5 MG TABS Take by mouth at bedtime.    ? Multiple Vitamins-Minerals (MULTIVITAMIN PO) Take by mouth daily. Tablet ?Preservision    ? omeprazole (PRILOSEC) 20 MG capsule Take 20 mg by mouth daily.    ? POTASSIUM GLUCONATE PO Take 550 mg by mouth every other day.     ? Zinc 30 MG TABS Take 50 mg by mouth every other day.     ? ?Current Facility-Administered Medications  ?Medication Dose Route Frequency Provider Last Rate Last Admin  ? 0.9 %  sodium chloride infusion  500 mL Intravenous Continuous Ladene Artist, MD      ? ? ?  Allergies as of 08/13/2021 - Review Complete 08/13/2021  ?Allergen Reaction Noted  ? Codeine Nausea And Vomiting 08/17/2012  ? Sulfa antibiotics Rash 08/17/2012  ? ? ?Family History  ?Adopted: Yes  ?Problem Relation Age of Onset  ? Diabetes Daughter   ? Colon cancer Neg Hx   ? ? ?Social History  ? ?Socioeconomic History  ? Marital status: Married  ?  Spouse name: Not on file  ? Number of children: 1  ? Years of education: Not on file  ? Highest education level: Master's degree (e.g., MA, MS, MEng, MEd, MSW, MBA)  ?Occupational History  ? Occupation: Engineer, maintenance (IT)  ?Tobacco Use  ? Smoking status: Never  ? Smokeless tobacco: Never  ?Vaping Use  ? Vaping Use: Never used  ?Substance and Sexual Activity  ?  Alcohol use: Yes  ?  Comment: 3-4 glasses wine a day   ? Drug use: Yes  ?  Types: Marijuana  ?  Comment: pt states he takes 1 puff a night- occasionally  ? Sexual activity: Not Currently  ?  Comment: number of sex partners in the last 56 months  1 Joycelyn Schmid)  ?Other Topics Concern  ? Not on file  ?Social History Narrative  ? Exercise walking 2 times a week for 1 mile  ? Married  ? CPA  ? MBA  ? ?Social Determinants of Health  ? ?Financial Resource Strain: Not on file  ?Food Insecurity: Not on file  ?Transportation Needs: Not on file  ?Physical Activity: Not on file  ?Stress: Not on file  ?Social Connections: Not on file  ?Intimate Partner Violence: Not on file  ? ? ?Review of Systems: ? ?All systems reviewed an negative except where noted in HPI. ? ?Gen: Denies any fever, chills, sweats, anorexia, fatigue, weakness, malaise, weight loss, and sleep disorder ?CV: Denies chest pain, angina, palpitations, syncope, orthopnea, PND, peripheral edema, and claudication. ?Resp: Denies dyspnea at rest, dyspnea with exercise, cough, sputum, wheezing, coughing up blood, and pleurisy. ?GI: Denies vomiting blood, jaundice, and fecal incontinence.   Denies dysphagia or odynophagia. ?GU : Denies urinary burning, blood in urine, urinary frequency, urinary hesitancy, nocturnal urination, and urinary incontinence. ?MS: Denies joint pain, limitation of movement, and swelling, stiffness, low back pain, extremity pain. Denies muscle weakness, cramps, atrophy.  ?Derm: Denies rash, itching, dry skin, hives, moles, warts, or unhealing ulcers.  ?Psych: Denies depression, anxiety, memory loss, suicidal ideation, hallucinations, paranoia, and confusion. ?Heme: Denies bruising, bleeding, and enlarged lymph nodes. ?Neuro:  Denies any headaches, dizziness, paresthesias. ?Endo:  Denies any problems with DM, thyroid, adrenal function. ? ? ?Physical Exam: ?General:  Alert, well-developed, in NAD ?Head:  Normocephalic and atraumatic. ?Eyes:  Sclera  clear, no icterus.   Conjunctiva pink. ?Ears:  Normal auditory acuity. ?Mouth:  No deformity or lesions.  ?Neck:  Supple; no masses . ?Lungs:  Clear throughout to auscultation.   No wheezes, crackles, or rhonchi. No acute distress. ?Heart:  Regular rate and rhythm; no murmurs. ?Abdomen:  Soft, nondistended, nontender. No masses, hepatomegaly. No obvious masses.  Normal bowel .    ?Rectal:  Deferred   ?Msk:  Symmetrical without gross deformities.Marland Kitchen ?Pulses:  Normal pulses noted. ?Extremities:  Without edema. ?Neurologic:  Alert and  oriented x4;  grossly normal neurologically. ?Skin:  Intact without significant lesions or rashes. ?Cervical Nodes:  No significant cervical adenopathy. ?Psych:  Alert and cooperative. Normal mood and affect. ? ? ?Impression / Plan:  ? ?Personal history of 4 adenomatous colon polyps on last  colonoscopy here for surveillance colonoscopy. ? ?Odelia Graciano T. Fuller Plan  08/13/2021, 3:37 PM ?See Shea Evans, [facility], to contact our on call provider ? ? ?  ?

## 2021-08-13 NOTE — Progress Notes (Signed)
No problems noted in the recovery room. maw 

## 2021-08-15 ENCOUNTER — Telehealth: Payer: Self-pay

## 2021-08-15 NOTE — Telephone Encounter (Signed)
?  Follow up Call- ? ? ?  08/13/2021  ?  3:05 PM  ?Call back number  ?Post procedure Call Back phone  # [phone]  ?Permission to leave phone message Yes  ?  ? ?Patient questions: ? ?Do you have a fever, pain , or abdominal swelling? No. ?Pain Score  0 * ? ?Have you tolerated food without any problems? Yes.   ? ?Have you been able to return to your normal activities? Yes.   ? ?Do you have any questions about your discharge instructions: ?Diet   No. ?Medications  No. ?Follow up visit  No. ? ?Do you have questions or concerns about your Care? No. ? ?Actions: ?* If pain score is 4 or above: ?No action needed, pain <4. ? ? ?

## 2021-08-19 ENCOUNTER — Encounter: Payer: Self-pay | Admitting: Gastroenterology

## 2021-10-03 ENCOUNTER — Ambulatory Visit (INDEPENDENT_AMBULATORY_CARE_PROVIDER_SITE_OTHER): Payer: PPO | Admitting: Family Medicine

## 2021-10-03 ENCOUNTER — Encounter: Payer: Self-pay | Admitting: Family Medicine

## 2021-10-03 VITALS — BP 124/68 | HR 58 | Temp 98.0°F | Resp 16 | Ht 71.5 in | Wt 146.4 lb

## 2021-10-03 DIAGNOSIS — R17 Unspecified jaundice: Secondary | ICD-10-CM | POA: Diagnosis not present

## 2021-10-03 DIAGNOSIS — R7989 Other specified abnormal findings of blood chemistry: Secondary | ICD-10-CM

## 2021-10-03 DIAGNOSIS — E785 Hyperlipidemia, unspecified: Secondary | ICD-10-CM

## 2021-10-03 LAB — COMPREHENSIVE METABOLIC PANEL
ALT: 40 U/L (ref 0–53)
AST: 34 U/L (ref 0–37)
Albumin: 4.6 g/dL (ref 3.5–5.2)
Alkaline Phosphatase: 78 U/L (ref 39–117)
BUN: 10 mg/dL (ref 6–23)
CO2: 33 mEq/L — ABNORMAL HIGH (ref 19–32)
Calcium: 9.7 mg/dL (ref 8.4–10.5)
Chloride: 103 mEq/L (ref 96–112)
Creatinine, Ser: 0.8 mg/dL (ref 0.40–1.50)
GFR: 87.14 mL/min (ref >60.00)
Glucose, Bld: 85 mg/dL (ref 70–99)
Potassium: 4.7 mEq/L (ref 3.5–5.1)
Sodium: 141 mEq/L (ref 135–145)
Total Bilirubin: 1.7 mg/dL — ABNORMAL HIGH (ref 0.2–1.2)
Total Protein: 7.3 g/dL (ref 6.0–8.3)

## 2021-10-03 LAB — LIPID PANEL
Cholesterol: 198 mg/dL (ref 0–200)
HDL: 60.3 mg/dL (ref >39.00)
LDL Cholesterol: 124 mg/dL — ABNORMAL HIGH (ref 0–99)
NonHDL: 137.2
Total CHOL/HDL Ratio: 3
Triglycerides: 68 mg/dL (ref 0.0–149.0)
VLDL: 13.6 mg/dL (ref 0.0–40.0)

## 2021-10-03 LAB — CBC
HCT: 43.7 % (ref 39.0–52.0)
Hemoglobin: 14.6 g/dL (ref 13.0–17.0)
MCHC: 33.4 g/dL (ref 30.0–36.0)
MCV: 97.6 fl (ref 78.0–100.0)
Platelets: 186 10*3/uL (ref 150.0–400.0)
RBC: 4.48 Mil/uL (ref 4.22–5.81)
RDW: 12.8 % (ref 11.5–15.5)
WBC: 3.9 10*3/uL — ABNORMAL LOW (ref 4.0–10.5)

## 2021-10-03 NOTE — Patient Instructions (Signed)
If any concerns on your labs I will let you know.  Thanks for coming in today.

## 2021-10-03 NOTE — Progress Notes (Signed)
Subjective:  Patient ID: Christopher Hendricks, male    DOB: 24-Feb-1946  Age: 76 y.o. MRN: [id number]  CC:  Chief Complaint  Patient presents with   Hyperlipidemia    HPI Christopher Hendricks presents for   Hyperlipidemia: No current meds.  Slight elevation at his wellness exam in December Slight elevated bilirubin previously, noted in December.  Alcohol 2-3 drinks per day when we discussed in June 2022.  Was walking for exercise at that time.  No change in diet/exercise since last visit. 2-3 alcohol drinks per day. Bili 0.8 in 2017, 1.4-1.5 in 2019, 1.0 in 2020. 1.4-1.5 in 2021-22. No abd pain, yellowing of skin or eyes, or n/v.    Borderline WBC in 03/2021. 3.6  Still walking 1.42m or more per day.  Retiring after this next season.  Wt Readings from Last 3 Encounters:  10/03/21 146 lb 6.4 oz (66.4 kg)  08/13/21 148 lb (67.1 kg)  07/31/21 148 lb (67.1 kg)  Weight 147 on 03/2021.  Weight 148 on 09/19/20.  3 year schedul for colonoscopy after May procedure.  Fasting today.   Lab Results  Component Value Date   CHOL 180 04/03/2021   HDL 57.10 04/03/2021   LDLCALC 107 (H) 04/03/2021   TRIG 78.0 04/03/2021   CHOLHDL 3 04/03/2021   Lab Results  Component Value Date   ALT 33 04/03/2021   AST 30 04/03/2021   ALKPHOS 75 04/03/2021   BILITOT 1.5 (H) 04/03/2021   HM: Covid booster in January. Shingrix at pharmacy 5 yrs ago.    History Patient Active Problem List   Diagnosis Date Noted   Right inguinal hernia 09/17/2018   Anemia 08/17/2012   Alcohol abuse 08/17/2012   Past Medical History:  Diagnosis Date   Alcoholism /alcohol abuse    Allergy    Arthritis    Blood transfusion without reported diagnosis    Duodenal ulcer    GERD (gastroesophageal reflux disease)    Heart murmur    as teen   Hyperlipidemia    Phreesia 03/18/2020   Neuromuscular disorder ([facility])    pinched nerve in neck that causes numbness in fingers on right hand    Right inguinal hernia  09/17/2018   Sigmoid diverticulosis 2007   Past Surgical History:  Procedure Laterality Date   COLONOSCOPY  2007   sigmoid diverticulosis.  Dr SFuller Plan  ESOPHAGOGASTRODUODENOSCOPY N/A 08/18/2012   Procedure: ESOPHAGOGASTRODUODENOSCOPY (EGD);  Surgeon: JIrene Shipper MD;  [facility];  Service: Endoscopy;  Laterality: N/A;   HERNIA REPAIR     age 76  INGUINAL HERNIA REPAIR Right 09/17/2018   Procedure: LAPAROSCOPIC REPAIR RIGHT INGUINAL HERNIA;  Surgeon: IFanny Skates MD;  [facility]  Service: General;  Laterality: Right;   SMALL INTESTINE SURGERY     TONSILLECTOMY     age 76  UPPER GASTROINTESTINAL ENDOSCOPY     VASECTOMY     Allergies  Allergen Reactions   Codeine Nausea And Vomiting   Sulfa Antibiotics Rash   Prior to Admission medications   Medication Sig Start Date End Date Taking? Authorizing Provider  Acetaminophen (TYLENOL PO) Take by mouth at bedtime. '250mg'$    Yes [provider]  diphenhydrAMINE (BENADRYL) 25 MG tablet Take 25 mg by mouth at bedtime.    Yes [provider]  Melatonin 5 MG TABS Take 10 mg by mouth at bedtime.   Yes [provider]  Multiple Vitamins-Minerals (MULTIVITAMIN PO) Take by mouth daily.  Tablet Preservision   Yes [provider]  omeprazole (PRILOSEC) 20 MG capsule Take 20 mg by mouth daily.   Yes [provider]  POTASSIUM GLUCONATE PO Take 550 mg by mouth every other day.    Yes [provider]  Zinc 30 MG TABS Take 50 mg by mouth every other day.    Yes [provider]  Glucosamine-Chondroit-Vit C-Mn (GLUCOSAMINE CHONDR 500 COMPLEX PO) Take 1 tablet by mouth daily. Patient not taking: Reported on 10/03/2021    [provider]   Social History   Socioeconomic History   Marital status: Married    Spouse name: Not on file   Number of children: 1   Years of education: Not on file   Highest education level: Master's degree (e.g., MA, MS,  MEng, MEd, MSW, MBA)  Occupational History   Occupation: Engineer, maintenance (IT)  Tobacco Use   Smoking status: Never   Smokeless tobacco: Never  Vaping Use   Vaping Use: Never used  Substance and Sexual Activity   Alcohol use: Yes    Comment: 3-4 glasses wine a day    Drug use: Yes    Types: Marijuana    Comment: pt states he takes 1 puff a night- occasionally   Sexual activity: Not Currently    Comment: number of sex partners in the last 42 months  1 ([name])  Other Topics Concern   Not on file  Social History Narrative   Exercise walking 2 times a week for 1 mile   Married   Engineer, maintenance (IT)   MBA   Social Determinants of Health   Financial Resource Strain: [place]  (03/18/2017)   Overall Financial Resource Strain (CARDIA)    Difficulty of Paying Living Expenses: Not hard at all  Food Insecurity: No Food Insecurity (03/18/2017)   Hunger Vital Sign    Worried About Running Out of Food in the Last Year: Never true    Ran Out of Food in the Last Year: Never true  Transportation Needs: No Transportation Needs (03/18/2017)   PRAPARE - Hydrologist (Medical): No    Lack of Transportation (Non-Medical): No  Physical Activity: Sufficiently Active (03/18/2017)   Exercise Vital Sign    Days of Exercise per Week: 5 days    Minutes of Exercise per Session: 30 min  Stress: Stress Concern Present (03/18/2017)   [place]    Feeling of Stress : To some extent  Social Connections: Moderately Integrated (03/18/2017)   Social Connection and Isolation Panel [NHANES]    Frequency of Communication with Friends and Family: More than three times a week    Frequency of Social Gatherings with Friends and Family: Never    Attends Religious Services: More than 4 times per year    Active Member of [employer] or Organizations: No    Attends Archivist Meetings: Never    Marital Status: Married  Human resources officer Violence: Not At  Risk (03/18/2017)   Humiliation, Afraid, Rape, and Kick questionnaire    Fear of Current or Ex-Partner: No    Emotionally Abused: No    Physically Abused: No    Sexually Abused: No    Review of Systems  Constitutional:  Negative for fatigue and unexpected weight change.  Eyes:  Negative for visual disturbance.  Respiratory:  Negative for cough, chest tightness and shortness of breath.   Cardiovascular:  Negative for chest pain, palpitations and leg swelling.  Gastrointestinal:  Negative for abdominal pain and blood in stool.  Neurological:  Negative for dizziness, light-headedness and headaches.     Objective:   Vitals:   10/03/21 0840  BP: 124/68  Pulse: (!) 58  Resp: 16  Temp: 98 F (36.7 C)  TempSrc: Temporal  SpO2: 98%  Weight: 146 lb 6.4 oz (66.4 kg)  Height: 5' 11.5" (1.816 m)     Physical Exam Vitals reviewed.  Constitutional:      Appearance: He is well-developed.     Comments: Thin, no distress, no jaundice.   HENT:     Head: Normocephalic and atraumatic.  Neck:     Vascular: No carotid bruit or JVD.  Cardiovascular:     Rate and Rhythm: Normal rate and regular rhythm.     Heart sounds: Normal heart sounds. No murmur heard. Pulmonary:     Effort: Pulmonary effort is normal.     Breath sounds: Normal breath sounds. No rales.  Musculoskeletal:     Right lower leg: No edema.     Left lower leg: No edema.  Skin:    General: Skin is warm and dry.  Neurological:     Mental Status: He is alert and oriented to person, place, and time.  Psychiatric:        Mood and Affect: Mood normal.        Assessment & Plan:  Christopher Hendricks is a 76 y.o. male . Elevated bilirubin - Plan: Comprehensive metabolic panel  Hyperlipidemia, unspecified hyperlipidemia type - Plan: Comprehensive metabolic panel, Lipid panel  Abnormal CBC - Plan: CBC Check lipids, repeat bilirubin.  If still elevated may need to look into other causes including possible ultrasound, or  work-up for Gilbert's.  Repeat labs to evaluate for need for statin.  Borderline WBC, repeat CBC.  No orders of the defined types were placed in this encounter.  Patient Instructions  If any concerns on your labs I will let you know.  Thanks for coming in today.    Signed,   Merri Ray, MD [place] 10/03/21 9:17 AM

## 2021-11-22 DIAGNOSIS — L821 Other seborrheic keratosis: Secondary | ICD-10-CM | POA: Diagnosis not present

## 2021-11-22 DIAGNOSIS — L570 Actinic keratosis: Secondary | ICD-10-CM | POA: Diagnosis not present

## 2021-11-22 DIAGNOSIS — L209 Atopic dermatitis, unspecified: Secondary | ICD-10-CM | POA: Diagnosis not present

## 2021-12-05 ENCOUNTER — Other Ambulatory Visit: Payer: Self-pay

## 2021-12-05 ENCOUNTER — Emergency Department (HOSPITAL_COMMUNITY)
Admission: EM | Admit: 2021-12-05 | Discharge: 2021-12-06 | Disposition: A | Discharge status: Home / Self Care | Payer: PPO | Attending: Emergency Medicine | Admitting: Emergency Medicine

## 2021-12-05 ENCOUNTER — Encounter (HOSPITAL_COMMUNITY): Payer: Self-pay

## 2021-12-05 ENCOUNTER — Emergency Department (HOSPITAL_COMMUNITY): Payer: PPO

## 2021-12-05 DIAGNOSIS — G319 Degenerative disease of nervous system, unspecified: Secondary | ICD-10-CM | POA: Diagnosis not present

## 2021-12-05 DIAGNOSIS — S0101XA Laceration without foreign body of scalp, initial encounter: Secondary | ICD-10-CM | POA: Diagnosis not present

## 2021-12-05 DIAGNOSIS — S0990XA Unspecified injury of head, initial encounter: Secondary | ICD-10-CM | POA: Diagnosis present

## 2021-12-05 DIAGNOSIS — S0181XA Laceration without foreign body of other part of head, initial encounter: Secondary | ICD-10-CM | POA: Diagnosis not present

## 2021-12-05 DIAGNOSIS — W01198A Fall on same level from slipping, tripping and stumbling with subsequent striking against other object, initial encounter: Secondary | ICD-10-CM | POA: Diagnosis not present

## 2021-12-05 DIAGNOSIS — Z23 Encounter for immunization: Secondary | ICD-10-CM | POA: Diagnosis not present

## 2021-12-05 MED ORDER — TETANUS-DIPHTH-ACELL PERTUSSIS 5-2.5-18.5 LF-MCG/0.5 IM SUSY
0.5000 mL | PREFILLED_SYRINGE | Freq: Once | INTRAMUSCULAR | Status: AC
  Administered 2021-12-05: 0.5 mL via INTRAMUSCULAR
  Filled 2021-12-05: qty 0.5

## 2021-12-05 MED ORDER — LIDOCAINE-EPINEPHRINE (PF) 2 %-1:200000 IJ SOLN
10.0000 mL | Freq: Once | INTRAMUSCULAR | Status: AC
  Administered 2021-12-05: 10 mL via INTRADERMAL
  Filled 2021-12-05: qty 20

## 2021-12-05 NOTE — ED Provider Triage Note (Signed)
Emergency Medicine Provider Triage Evaluation Note  Christopher Hendricks , a 76 y.o. male  was evaluated in triage.  Pt complains of head injury that occurred just PTA. Patient was taking his plant outside for it to get some rainwater, and as he was crouched down, he lost his balance and fell forward, hitting his forehead on the brick steps. No LOC. He has minimal pain right now but has a large laceration on his forehead. Not on anticoagulation.  Review of Systems  Positive: Head laceration Negative: Headache, blurred vision, numbness, tingling  Physical Exam  BP 135/80 (BP Location: Right Arm)   Pulse (!) 59   Temp 98 F (36.7 C) (Oral)   Resp 15   Ht 5' 11.5" (1.816 m)   Wt 66.7 kg   SpO2 100%   BMI 20.22 kg/m  Gen:   Awake, no distress   Resp:  Normal effort  MSK:   Moves extremities without difficulty  Other:  No battle sign, no raccoon eyes. 4 cm laceration to forehead with some bruising  Medical Decision Making  Medically screening exam initiated at 10:30 PM.  Appropriate orders placed.  Christopher Hendricks was informed that the remainder of the evaluation will be completed by another provider, this initial triage assessment does not replace that evaluation, and the importance of remaining in the ED until their evaluation is complete.     Christopher Hendricks, [place] 12/05/21 2232

## 2021-12-05 NOTE — ED Provider Notes (Incomplete)
[facility] DEPT Provider Note   CSN: [id number] Arrival date & time: 12/05/21  2218     History {Add pertinent medical, surgical, social history, OB history to HPI:1} Chief Complaint  Patient presents with  . Head Injury    Christopher Hendricks is a 76 y.o. male.  HPI     Home Medications Prior to Admission medications   Medication Sig Start Date End Date Taking? Authorizing Provider  Acetaminophen (TYLENOL PO) Take by mouth at bedtime. '250mg'$     [provider]  diphenhydrAMINE (BENADRYL) 25 MG tablet Take 25 mg by mouth at bedtime.     [provider]  Glucosamine-Chondroit-Vit C-Mn (GLUCOSAMINE CHONDR 500 COMPLEX PO) Take 1 tablet by mouth daily. Patient not taking: Reported on 10/03/2021    [provider]  Melatonin 5 MG TABS Take 10 mg by mouth at bedtime.    [provider]  Multiple Vitamins-Minerals (MULTIVITAMIN PO) Take by mouth daily. Tablet Preservision    [provider]  omeprazole (PRILOSEC) 20 MG capsule Take 20 mg by mouth daily.    [provider]  POTASSIUM GLUCONATE PO Take 550 mg by mouth every other day.     [provider]  Zinc 30 MG TABS Take 50 mg by mouth every other day.     [provider]      Allergies    Codeine and Sulfa antibiotics    Review of Systems   Review of Systems  Physical Exam Updated Vital Signs BP 135/80 (BP Location: Right Arm)   Pulse (!) 59   Temp 98 F (36.7 C) (Oral)   Resp 15   Ht 5' 11.5" (1.816 m)   Wt 66.7 kg   SpO2 100%   BMI 20.22 kg/m  Physical Exam  ED Results / Procedures / Treatments   Labs (all labs ordered are listed, but only abnormal results are displayed) Labs Reviewed - No data to display  EKG None  Radiology CT Head Wo Contrast  Result Date: 12/05/2021 CLINICAL DATA:  Head trauma laceration EXAM: CT HEAD WITHOUT CONTRAST TECHNIQUE: Contiguous axial images were obtained from the base of  the skull through the vertex without intravenous contrast. RADIATION DOSE REDUCTION: This exam was performed according to the departmental dose-optimization program which includes automated exposure control, adjustment of the mA and/or kV according to patient size and/or use of iterative reconstruction technique. COMPARISON:  None Available. FINDINGS: Brain: No acute territorial infarction, hemorrhage or intracranial mass. Atrophy. Mild hypodensity in the white matter consistent with chronic small vessel ischemic change. Ventricles are nonenlarged Vascular: No hyperdense vessels.  No unexpected calcification Skull: Normal. Negative for fracture or focal lesion. Sinuses/Orbits: No acute finding. Other: Right forehead scalp laceration IMPRESSION: 1. No CT evidence for acute intracranial abnormality. 2. Mild atrophy and chronic small vessel ischemic changes of the white matter Electronically Signed   By: Donavan Foil M.D.   On: 12/05/2021 22:56    Procedures Procedures  {Document cardiac monitor, telemetry assessment procedure when appropriate:1}  Medications Ordered in ED Medications - No data to display  ED Course/ Medical Decision Making/ A&P                           Medical Decision Making  ***  {Document critical care time when appropriate:1} {Document review of labs and clinical decision tools ie heart score, Chads2Vasc2 etc:1}  {Document your independent review of radiology images, and any outside records:1} {  Document your discussion with family members, caretakers, and with consultants:1} {Document social determinants of health affecting pt's care:1} {Document your decision making why or why not admission, treatments were needed:1} Final Clinical Impression(s) / ED Diagnoses Final diagnoses:  None    Rx / DC Orders ED Discharge Orders     None

## 2021-12-05 NOTE — ED Triage Notes (Signed)
Was watering flowers and fell over. ~5 cm laceration on forehead fairly deep.   Denies LOC or anticoagulants. Bleeding controlled.

## 2021-12-05 NOTE — ED Provider Notes (Signed)
[facility] DEPT Provider Note   CSN: [id number] Arrival date & time: 12/05/21  2218     Histor Chief Complaint  Patient presents with   Head Injury    Christopher Hendricks is a 76 y.o. male who presents with concern for laceration to the forehead sustained this evening and mechanical fall.  Patient was carrying a potted plant outside to place it in the rain.  There has been stepping off the porch into the rain he decided to lean forward and try to set the plan on the ground.  In doing so he fell forward and struck his forehead on the brick.  He denies any hyperextension of his neck at the time denies any neck pain.  Denies any LOC, nausea vomiting or blurry vision at this time.  He was endorsing exquisite bleeding from the area.  He is not anticoagulated.  I personally reviewed his medical record .  Patient has history of alcoholism, heart murmur and GERD.  HPI     Home Medications Prior to Admission medications   Medication Sig Start Date End Date Taking? Authorizing Provider  Acetaminophen (TYLENOL PO) Take by mouth at bedtime. '250mg'$    Yes [provider]  diphenhydrAMINE (BENADRYL) 25 MG tablet Take 25 mg by mouth at bedtime.    Yes [provider]  Melatonin 5 MG TABS Take 10 mg by mouth at bedtime.   Yes [provider]  Multiple Vitamins-Minerals (MULTIVITAMIN PO) Take by mouth daily. Tablet Preservision   Yes [provider]  omeprazole (PRILOSEC) 20 MG capsule Take 20 mg by mouth daily.   Yes [provider]  POTASSIUM GLUCONATE PO Take 550 mg by mouth every other day.    Yes [provider]  Zinc 30 MG TABS Take 50 mg by mouth every other day.    Yes [provider]  Glucosamine-Chondroit-Vit C-Mn (GLUCOSAMINE CHONDR 500 COMPLEX PO) Take 1 tablet by mouth daily. Patient not taking: Reported on 10/03/2021    [provider]      Allergies    Codeine and Sulfa antibiotics     Review of Systems   Review of Systems  Skin:  Positive for wound.    Physical Exam Updated Vital Signs BP 117/72   Pulse (!) 57   Temp 98 F (36.7 C) (Oral)   Resp 15   Ht 5' 11.5" (1.816 m)   Wt 66.7 kg   SpO2 100%   BMI 20.22 kg/m  Physical Exam Vitals and nursing note reviewed.  Constitutional:      Appearance: He is normal weight. He is not ill-appearing or toxic-appearing.  HENT:     Head: Normocephalic and atraumatic.   Eyes:     General: Lids are normal. Vision grossly intact. No scleral icterus.       Right eye: No discharge.        Left eye: No discharge.     Extraocular Movements: Extraocular movements intact.     Conjunctiva/sclera: Conjunctivae normal.     Pupils: Pupils are equal, round, and reactive to light.  Neck:     Trachea: Trachea and phonation normal.  Cardiovascular:     Heart sounds: Normal heart sounds. No murmur heard. Pulmonary:     Effort: Pulmonary effort is normal.  Abdominal:     Palpations: Abdomen is soft.  Musculoskeletal:     Cervical back: Normal range of motion. No rigidity. No pain with movement or spinous process tenderness. Normal range of  motion.  Lymphadenopathy:     Cervical: No cervical adenopathy.  Skin:    General: Skin is warm and dry.     Capillary Refill: Capillary refill takes less than 2 seconds.     Findings: Abrasion and laceration present.       Neurological:     General: No focal deficit present.     Mental Status: He is alert and oriented to person, place, and time.     GCS: GCS eye subscore is 4. GCS verbal subscore is 5. GCS motor subscore is 6.     Gait: Gait is intact.  Psychiatric:        Mood and Affect: Mood normal.     ED Results / Procedures / Treatments   Labs (all labs ordered are listed, but only abnormal results are displayed) Labs Reviewed - No data to display  EKG None  Radiology CT Head Wo Contrast  Result Date: 12/05/2021 CLINICAL DATA:  Head trauma laceration EXAM: CT  HEAD WITHOUT CONTRAST TECHNIQUE: Contiguous axial images were obtained from the base of the skull through the vertex without intravenous contrast. RADIATION DOSE REDUCTION: This exam was performed according to the departmental dose-optimization program which includes automated exposure control, adjustment of the mA and/or kV according to patient size and/or use of iterative reconstruction technique. COMPARISON:  None Available. FINDINGS: Brain: No acute territorial infarction, hemorrhage or intracranial mass. Atrophy. Mild hypodensity in the white matter consistent with chronic small vessel ischemic change. Ventricles are nonenlarged Vascular: No hyperdense vessels.  No unexpected calcification Skull: Normal. Negative for fracture or focal lesion. Sinuses/Orbits: No acute finding. Other: Right forehead scalp laceration IMPRESSION: 1. No CT evidence for acute intracranial abnormality. 2. Mild atrophy and chronic small vessel ischemic changes of the white matter Electronically Signed   By: Donavan Foil M.D.   On: 12/05/2021 22:56    Procedures .Marland KitchenLaceration Repair  Date/Time: 12/06/2021 12:30 AM  Performed by: Emeline Darling, PA-C Authorized by: Emeline Darling, PA-C   Consent:    Consent obtained:  Verbal   Consent given by:  Patient   Risks discussed:  Infection, need for additional repair, pain, poor cosmetic result and poor wound healing   Alternatives discussed:  No treatment and delayed treatment Universal protocol:    Procedure explained and questions answered to patient or proxy's satisfaction: yes     Relevant documents present and verified: yes     Test results available: yes     Imaging studies available: yes     Required blood products, implants, devices, and special equipment available: yes     Site/side marked: yes     Immediately prior to procedure, a time out was called: yes     Patient identity confirmed:  Verbally with patient Anesthesia:    Anesthesia method:   Local infiltration   Local anesthetic:  Lidocaine 2% WITH epi Laceration details:    Location:  Scalp   Scalp location:  Frontal   Length (cm):  4.5 Pre-procedure details:    Preparation:  Patient was prepped and draped in usual sterile fashion and imaging obtained to evaluate for foreign bodies Exploration:    Limited defect created (wound extended): no     Hemostasis achieved with:  Direct pressure and epinephrine   Imaging obtained comment:  CT head   Wound exploration: wound explored through full range of motion and entire depth of wound visualized     Wound extent: no foreign bodies/material noted, no muscle damage noted, no  tendon damage noted, no underlying fracture noted and no vascular damage noted     Contaminated: no   Treatment:    Area cleansed with:  Saline   Amount of cleaning:  Extensive   Irrigation solution:  Sterile saline Skin repair:    Repair method:  Sutures   Suture size:  5-0   Suture material:  Prolene   Suture technique:  Simple interrupted   Number of sutures:  7 Approximation:    Approximation:  Close Post-procedure details:    Dressing:  Antibiotic ointment and non-adherent dressing   Procedure completion:  Tolerated well, no immediate complications    Medications Ordered in ED Medications  bacitracin ointment (1 Application Topical Given 12/06/21 0038)  lidocaine-EPINEPHrine (XYLOCAINE W/EPI) 2 %-1:200000 (PF) injection 10 mL (10 mLs Intradermal Given 12/05/21 2341)  Tdap (BOOSTRIX) injection 0.5 mL (0.5 mLs Intramuscular Given 12/05/21 2340)    ED Course/ Medical Decision Making/ A&P                           Medical Decision Making 76 year old male who presents after mechanical fall with forehead laceration.   Vital signs are normal and intake.  Cardiopulmonary exam is normal, abdominal exam is benign.  Patient with normal examination of the C-spine without midline tenderness palpation or decreased range of motion.  PERRL, EOMI, GCS of 15.   Large laceration of the frontal scalp as above.  Tetanus is out of date.  Tetanus updated  Amount and/or Complexity of Data Reviewed Radiology:     Details: CT head negative for acute intracranial abnormality, visualized by this provider.   Wound repaired as above.  Patient tolerated the procedure well.  No further work-up warranted in the ED at this time.  Recommend suture removal in 7 to 10 days.  Wound care precautions given.  Kai  voiced understanding of his medical evaluation and treatment plan. Each of their questions answered to their expressed satisfaction.  Return precautions were given.  Patient is well-appearing, stable, and was discharged in good condition.   This chart was dictated using voice recognition software, Dragon. Despite the best efforts of this provider to proofread and correct errors, errors may still occur which can change documentation meaning.    Final Clinical Impression(s) / ED Diagnoses Final diagnoses:  Laceration of scalp, initial encounter    Rx / DC Orders ED Discharge Orders     None         Aura Dials 12/06/21 0041    Mesner, Corene Cornea, MD 12/06/21 [phone]

## 2021-12-06 MED ORDER — BACITRACIN ZINC 500 UNIT/GM EX OINT: TOPICAL_OINTMENT | Freq: Two times a day (BID) | CUTANEOUS | Status: DC

## 2021-12-06 MED ORDER — BACITRACIN ZINC 500 UNIT/GM EX OINT
TOPICAL_OINTMENT | CUTANEOUS | Status: AC
  Administered 2021-12-06: 1 via TOPICAL
  Filled 2021-12-06: qty 1.8

## 2021-12-06 NOTE — Discharge Instructions (Signed)
You are seen here today for your head injury.  CT scan was reassuring.  There is no bleeding in your brain.  You did have a large laceration to her forehead which was repaired using sutures.  You have 7 sutures in place which will need to be removed in 7 to 10 days.  This can be done by her primary care doctor or urgent care or the emergency department.  Please dress the area daily with antibiotic ointment and follow-up closely with your PCP for reevaluation.  Return the ER for any redness, swelling, puslike drainage from the area, or any other severe symptom.  Return also develop any nausea vomiting blurry double vision, or any other new severe symptoms.

## 2021-12-13 ENCOUNTER — Ambulatory Visit (INDEPENDENT_AMBULATORY_CARE_PROVIDER_SITE_OTHER): Payer: PPO | Admitting: Family Medicine

## 2021-12-13 ENCOUNTER — Encounter: Payer: Self-pay | Admitting: Family Medicine

## 2021-12-13 VITALS — BP 120/66 | HR 49 | Temp 97.8°F | Wt 147.8 lb

## 2021-12-13 DIAGNOSIS — Z4802 Encounter for removal of sutures: Secondary | ICD-10-CM

## 2021-12-13 DIAGNOSIS — S0101XD Laceration without foreign body of scalp, subsequent encounter: Secondary | ICD-10-CM

## 2021-12-13 NOTE — Patient Instructions (Signed)
Wound has healed well.  If possible leave the Steri-Strips in place for a day or 2 to allow further healing but if those fall off, that is okay.  Try not to pull or apply too much pressure to that wound until it is completely healed.  Let me know if there are questions and take care.

## 2021-12-13 NOTE — Progress Notes (Signed)
Subjective:  Patient ID: Christopher Hendricks, male    DOB: Mar 30, 1946  Age: 76 y.o. MRN: [id number]  CC:  Chief Complaint  Patient presents with   Suture / Staple Removal    Pt states all is well    HPI Christopher Hendricks presents for   Scalp laceration ER notes reviewed from August 24.  Laceration to forehead after mechanical fall, slipping off the porch, leaned forward, fell forward and struck forehead on the brick.  No LOC, nausea, vomiting or blurry vision.  Not on anticoagulation.  4.5 cm frontal laceration.  Other abrasions, hemostatic.  CT head without evidence of acute intracranial abnormality.  Suture repair note reviewed.  #7 sutures, 5-0 Prolene.  Simple interrupted.  Here for suture removal.  No new HA,n/v or blurry vision.  No bleeding from wound. Has applied bandage and neosporin. Now day 8 of sutures. No surrounding redness or d/c. Min itching. Bruising has improved.      History Patient Active Problem List   Diagnosis Date Noted   Right inguinal hernia 09/17/2018   Anemia 08/17/2012   Alcohol abuse 08/17/2012   Past Medical History:  Diagnosis Date   Alcoholism /alcohol abuse    Allergy    Arthritis    Blood transfusion without reported diagnosis    Duodenal ulcer    GERD (gastroesophageal reflux disease)    Heart murmur    as teen   Hyperlipidemia    Phreesia 03/18/2020   Neuromuscular disorder (HCC)    pinched nerve in neck that causes numbness in fingers on right hand    Right inguinal hernia 09/17/2018   Sigmoid diverticulosis 2007   Past Surgical History:  Procedure Laterality Date   COLONOSCOPY  2007   sigmoid diverticulosis.  Dr Fuller Plan   ESOPHAGOGASTRODUODENOSCOPY N/A 08/18/2012   Procedure: ESOPHAGOGASTRODUODENOSCOPY (EGD);  Surgeon: Irene Shipper, MD;  Location: [facility] ENDOSCOPY;  Service: Endoscopy;  Laterality: N/A;   HERNIA REPAIR     age 34   INGUINAL HERNIA REPAIR Right 09/17/2018   Procedure: LAPAROSCOPIC REPAIR RIGHT INGUINAL HERNIA;   Surgeon: Fanny Skates, MD;  Location: [facility];  Service: General;  Laterality: Right;   SMALL INTESTINE SURGERY     TONSILLECTOMY     age 89   UPPER GASTROINTESTINAL ENDOSCOPY     VASECTOMY     Allergies  Allergen Reactions   Codeine Nausea And Vomiting and Other (See Comments)   Sulfa Antibiotics Rash    Other reaction(s): Unknown   Prior to Admission medications   Medication Sig Start Date End Date Taking? Authorizing Provider  Acetaminophen (TYLENOL PO) Take by mouth at bedtime. '250mg'$    Yes [provider]  Ascorbic Acid (VITAMIN C) 100 MG tablet Take 100 mg by mouth daily.   Yes [provider]  diphenhydrAMINE (BENADRYL) 25 MG tablet Take 25 mg by mouth at bedtime.    Yes [provider]  Melatonin 5 MG TABS Take 10 mg by mouth at bedtime.   Yes [provider]  Multiple Vitamins-Minerals (MULTIVITAMIN PO) Take by mouth daily. Tablet Preservision   Yes [provider]  omeprazole (PRILOSEC) 20 MG capsule Take 20 mg by mouth daily.   Yes [provider]  POTASSIUM GLUCONATE PO Take 550 mg by mouth every other day.    Yes [provider]  Zinc 30 MG TABS Take 50 mg by mouth every other day.    Yes [provider]  Glucosamine-Chondroit-Vit C-Mn (GLUCOSAMINE CHONDR 500  COMPLEX PO) Take 1 tablet by mouth daily. Patient not taking: Reported on 10/03/2021    [provider]   Social History   Socioeconomic History   Marital status: Married    Spouse name: Not on file   Number of children: 1   Years of education: Not on file   Highest education level: Master's degree (e.g., MA, MS, MEng, MEd, MSW, MBA)  Occupational History   Occupation: Engineer, maintenance (IT)  Tobacco Use   Smoking status: Never   Smokeless tobacco: Never  Vaping Use   Vaping Use: Never used  Substance and Sexual Activity   Alcohol use: Yes    Comment: 3-4 glasses wine a day    Drug use: Yes    Types: Marijuana    Comment:  pt states he takes 1 puff a night- occasionally   Sexual activity: Not Currently    Comment: number of sex partners in the last 63 months  1 ([name])  Other Topics Concern   Not on file  Social History Narrative   Exercise walking 2 times a week for 1 mile   Married   Engineer, maintenance (IT)   MBA   Social Determinants of Health   Financial Resource Strain: [place]  (03/18/2017)   Overall Financial Resource Strain (CARDIA)    Difficulty of Paying Living Expenses: Not hard at all  Food Insecurity: No Food Insecurity (03/18/2017)   Hunger Vital Sign    Worried About Running Out of Food in the Last Year: Never true    Ran Out of Food in the Last Year: Never true  Transportation Needs: No Transportation Needs (03/18/2017)   PRAPARE - Hydrologist (Medical): No    Lack of Transportation (Non-Medical): No  Physical Activity: Sufficiently Active (03/18/2017)   Exercise Vital Sign    Days of Exercise per Week: 5 days    Minutes of Exercise per Session: 30 min  Stress: Stress Concern Present (03/18/2017)   [place]    Feeling of Stress : To some extent  Social Connections: Moderately Integrated (03/18/2017)   Social Connection and Isolation Panel [NHANES]    Frequency of Communication with Friends and Family: More than three times a week    Frequency of Social Gatherings with Friends and Family: Never    Attends Religious Services: More than 4 times per year    Active Member of [employer] or Organizations: No    Attends Archivist Meetings: Never    Marital Status: Married  Human resources officer Violence: Not At Risk (03/18/2017)   Humiliation, Afraid, Rape, and Kick questionnaire    Fear of Current or Ex-Partner: No    Emotionally Abused: No    Physically Abused: No    Sexually Abused: No    Review of Systems  Per HPI  Objective:   Vitals:   12/13/21 1141  BP: 120/66  Pulse: (!) 49  Temp: 97.8 F  (36.6 C)  SpO2: 99%  Weight: 147 lb 12.8 oz (67 kg)     Physical Exam Constitutional:      General: He is not in acute distress.    Appearance: Normal appearance. He is well-developed.  HENT:     Head: Normocephalic and atraumatic.  Cardiovascular:     Rate and Rhythm: Normal rate.  Pulmonary:     Effort: Pulmonary effort is normal.  Skin:    Comments: Healing wound of right frontal scalp, 7 sutures intact, see photo before  and after suture removal.  No complications.  2 Steri-Strips applied.  Aftercare discussed.  Neurological:     Mental Status: He is alert and oriented to person, place, and time.  Psychiatric:        Mood and Affect: Mood normal.          Assessment & Plan:  JASSEN SARVER is a 76 y.o. male . Laceration of scalp without foreign body, subsequent encounter Doing well, healing, sutures removed without difficulty, aftercare discussed, Steri-Strips applied for next few days if possible.  No headache or other symptoms at this time.  RTC/ER precautions given.  No orders of the defined types were placed in this encounter.  There are no Patient Instructions on file for this visit.    Signed,   Merri Ray, MD [place] 12/13/21 12:09 PM

## 2022-04-04 ENCOUNTER — Encounter: Payer: Self-pay | Admitting: Family Medicine

## 2022-04-04 ENCOUNTER — Ambulatory Visit (INDEPENDENT_AMBULATORY_CARE_PROVIDER_SITE_OTHER): Payer: PPO | Admitting: Family Medicine

## 2022-04-04 VITALS — BP 122/78 | HR 53 | Temp 97.7°F | Ht 71.5 in | Wt 146.0 lb

## 2022-04-04 DIAGNOSIS — R17 Unspecified jaundice: Secondary | ICD-10-CM | POA: Diagnosis not present

## 2022-04-04 DIAGNOSIS — R351 Nocturia: Secondary | ICD-10-CM | POA: Diagnosis not present

## 2022-04-04 DIAGNOSIS — Z789 Other specified health status: Secondary | ICD-10-CM | POA: Diagnosis not present

## 2022-04-04 DIAGNOSIS — E785 Hyperlipidemia, unspecified: Secondary | ICD-10-CM | POA: Diagnosis not present

## 2022-04-04 LAB — LIPID PANEL
Cholesterol: 190 mg/dL (ref 0–200)
HDL: 52.3 mg/dL (ref >39.00)
LDL Cholesterol: 125 mg/dL — ABNORMAL HIGH (ref 0–99)
NonHDL: 137.69
Total CHOL/HDL Ratio: 4
Triglycerides: 61 mg/dL (ref 0.0–149.0)
VLDL: 12.2 mg/dL (ref 0.0–40.0)

## 2022-04-04 LAB — CBC
HCT: 43.4 % (ref 39.0–52.0)
Hemoglobin: 14.6 g/dL (ref 13.0–17.0)
MCHC: 33.5 g/dL (ref 30.0–36.0)
MCV: 95.2 fl (ref 78.0–100.0)
Platelets: 248 10*3/uL (ref 150.0–400.0)
RBC: 4.56 Mil/uL (ref 4.22–5.81)
RDW: 12.7 % (ref 11.5–15.5)
WBC: 4.3 10*3/uL (ref 4.0–10.5)

## 2022-04-04 LAB — COMPREHENSIVE METABOLIC PANEL
ALT: 37 U/L (ref 0–53)
AST: 28 U/L (ref 0–37)
Albumin: 4.7 g/dL (ref 3.5–5.2)
Alkaline Phosphatase: 86 U/L (ref 39–117)
BUN: 10 mg/dL (ref 6–23)
CO2: 34 mEq/L — ABNORMAL HIGH (ref 19–32)
Calcium: 9.7 mg/dL (ref 8.4–10.5)
Chloride: 102 mEq/L (ref 96–112)
Creatinine, Ser: 0.73 mg/dL (ref 0.40–1.50)
GFR: 89.27 mL/min (ref >60.00)
Glucose, Bld: 87 mg/dL (ref 70–99)
Potassium: 4.6 mEq/L (ref 3.5–5.1)
Sodium: 142 mEq/L (ref 135–145)
Total Bilirubin: 1.2 mg/dL (ref 0.2–1.2)
Total Protein: 7.1 g/dL (ref 6.0–8.3)

## 2022-04-04 NOTE — Progress Notes (Signed)
Subjective:  Patient ID: Christopher Hendricks, male    DOB: [birth year]  Age: 76 y.o. MRN: [id number]  CC:  Chief Complaint  Patient presents with   Hyperlipidemia    Pt states states all is well    HPI Christopher Hendricks presents for   Hyperlipidemia: No current meds, slight elevation previously, walking for exercise, alcohol few drinks per day.  History of elevated bilirubin.  Other LFTs normal.  1.7 in June, previously 1.5. heavier alcohol prior.  Down to 2 glasses of wine per night, 7 oz each. Some difficulty with decreasing amounts. Helps to relax. Last year of taxes this upcoming year.  Has been trying to watch diet. Avoiding red meats usually. More salmon, bean burgers. Fruit daily.  No jaundice or scleral icterus.  The 10-year ASCVD risk score (Arnett DK, et al., 2019) is: 21.6%   Values used to calculate the score:     Age: 57 years     Sex: Male     Is Non-Hispanic African American: No     Diabetic: No     Tobacco smoker: No     Systolic Blood Pressure: 810 mmHg     Is BP treated: No     HDL Cholesterol: 60.3 mg/dL     Total Cholesterol: 198 mg/dL Lab Results  Component Value Date   CHOL 198 10/03/2021   HDL 60.30 10/03/2021   LDLCALC 124 (H) 10/03/2021   TRIG 68.0 10/03/2021   CHOLHDL 3 10/03/2021   Lab Results  Component Value Date   ALT 40 10/03/2021   AST 34 10/03/2021   ALKPHOS 78 10/03/2021   BILITOT 1.7 (H) 10/03/2021   Nocturia: Up to 3 times per night. Few drinks at night.  No recent changes, no dysuria, no hematuria.  Urinating ok during the day.  No recent changes in stream. Declines urology referral at this time.  Lab Results  Component Value Date   PSA1 1.0 03/31/2018   PSA1 1.4 03/18/2017   PSA 1.1 03/13/2016   PSA 1.08 03/10/2014   PSA 0.65 09/08/2012       04/04/2022    8:38 AM 12/13/2021   11:40 AM 10/03/2021    8:44 AM 04/03/2021    8:15 AM 09/19/2020    8:13 AM  Depression screen PHQ 2/9  Decreased Interest 0 0 0 0 0  Down,  Depressed, Hopeless 0 0  0 0  PHQ - 2 Score 0 0 0 0 0  Altered sleeping 0 1  1 0  Tired, decreased energy 0 1  0 0  Change in appetite 0 0  0 0  Feeling bad or failure about yourself  0 1  0 0  Trouble concentrating 0 0  0 0  Moving slowly or fidgety/restless 0 0  0 0  Suicidal thoughts 0 0  0 0  PHQ-9 Score 0 3  1 0  Difficult doing work/chores    Not difficult at all    Covid booster and flu vaccine few weeks ago.   History Patient Active Problem List   Diagnosis Date Noted   Anemia 08/17/2012   Alcohol abuse 08/17/2012   Past Medical History:  Diagnosis Date   Alcoholism /alcohol abuse    Allergy    Arthritis    Blood transfusion without reported diagnosis    Duodenal ulcer    GERD (gastroesophageal reflux disease)    Heart murmur    as teen   Hyperlipidemia    Phreesia 03/18/2020  Neuromuscular disorder ([place])    pinched nerve in neck that causes numbness in fingers on right hand    Right inguinal hernia 09/17/2018   Sigmoid diverticulosis 2007   Past Surgical History:  Procedure Laterality Date   COLONOSCOPY  2007   sigmoid diverticulosis.  Dr Fuller Plan   ESOPHAGOGASTRODUODENOSCOPY N/A 08/18/2012   Procedure: ESOPHAGOGASTRODUODENOSCOPY (EGD);  Surgeon: Irene Shipper, MD;  Location: [facility] ENDOSCOPY;  Service: Endoscopy;  Laterality: N/A;   HERNIA REPAIR     age 79   INGUINAL HERNIA REPAIR Right 09/17/2018   Procedure: LAPAROSCOPIC REPAIR RIGHT INGUINAL HERNIA;  Surgeon: Fanny Skates, MD;  Location: [place];  Service: General;  Laterality: Right;   SMALL INTESTINE SURGERY     TONSILLECTOMY     age 71   UPPER GASTROINTESTINAL ENDOSCOPY     VASECTOMY     Allergies  Allergen Reactions   Codeine Nausea And Vomiting and Other (See Comments)   Sulfa Antibiotics Rash    Other reaction(s): Unknown   Prior to Admission medications   Medication Sig Start Date End Date Taking? Authorizing Provider  Acetaminophen (TYLENOL PO) Take by mouth at bedtime. '250mg'$     Yes [provider]  Ascorbic Acid (VITAMIN C) 100 MG tablet Take 100 mg by mouth daily.   Yes [provider]  diphenhydrAMINE (BENADRYL) 25 MG tablet Take 25 mg by mouth at bedtime.    Yes [provider]  Melatonin 5 MG TABS Take 10 mg by mouth at bedtime.   Yes [provider]  Multiple Vitamins-Minerals (MULTIVITAMIN PO) Take by mouth daily. Tablet Preservision   Yes [provider]  omeprazole (PRILOSEC) 20 MG capsule Take 20 mg by mouth daily.   Yes [provider]  POTASSIUM GLUCONATE PO Take 550 mg by mouth every other day.    Yes [provider]  Zinc 30 MG TABS Take 50 mg by mouth every other day.    Yes [provider]  Glucosamine-Chondroit-Vit C-Mn (GLUCOSAMINE CHONDR 500 COMPLEX PO) Take 1 tablet by mouth daily. Patient not taking: Reported on 10/03/2021    [provider]   Social History   Socioeconomic History   Marital status: Married    Spouse name: Not on file   Number of children: 1   Years of education: Not on file   Highest education level: Master's degree (e.g., MA, MS, MEng, MEd, MSW, MBA)  Occupational History   Occupation: Engineer, maintenance (IT)  Tobacco Use   Smoking status: Never   Smokeless tobacco: Never  Vaping Use   Vaping Use: Never used  Substance and Sexual Activity   Alcohol use: Yes    Comment: 3-4 glasses wine a day    Drug use: Yes    Types: Marijuana    Comment: pt states he takes 1 puff a night- occasionally   Sexual activity: Not Currently    Comment: number of sex partners in the last 59 months  1 ([name])  Other Topics Concern   Not on file  Social History Narrative   Exercise walking 2 times a week for 1 mile   Married   Engineer, maintenance (IT)   MBA   Social Determinants of Health   Financial Resource Strain: Low Risk  (03/18/2017)   Overall Financial Resource Strain (CARDIA)    Difficulty of Paying Living Expenses: Not hard at all  Food Insecurity: No Food Insecurity  (03/18/2017)   Hunger Vital Sign    Worried About Running Out of Food in the  Last Year: Never true    [place] in the Last Year: Never true  Transportation Needs: No Transportation Needs (03/18/2017)   PRAPARE - Hydrologist (Medical): No    Lack of Transportation (Non-Medical): No  Physical Activity: Sufficiently Active (03/18/2017)   Exercise Vital Sign    Days of Exercise per Week: 5 days    Minutes of Exercise per Session: 30 min  Stress: Stress Concern Present (03/18/2017)   [place]    Feeling of Stress : To some extent  Social Connections: Moderately Integrated (03/18/2017)   Social Connection and Isolation Panel [NHANES]    Frequency of Communication with Friends and Family: More than three times a week    Frequency of Social Gatherings with Friends and Family: Never    Attends Religious Services: More than 4 times per year    Active Member of [employer] or Organizations: No    Attends Archivist Meetings: Never    Marital Status: Married  Human resources officer Violence: Not At Risk (03/18/2017)   Humiliation, Afraid, Rape, and Kick questionnaire    Fear of Current or Ex-Partner: No    Emotionally Abused: No    Physically Abused: No    Sexually Abused: No    Review of Systems   Objective:   Vitals:   04/04/22 0842  BP: 122/78  Pulse: (!) 53  Temp: 97.7 F (36.5 C)  SpO2: 93%  Weight: 146 lb (66.2 kg)  Height: 5' 11.5" (1.816 m)     Physical Exam Vitals reviewed.  Constitutional:      Appearance: He is well-developed.  HENT:     Head: Normocephalic and atraumatic.  Neck:     Vascular: No carotid bruit or JVD.  Cardiovascular:     Rate and Rhythm: Normal rate and regular rhythm.     Heart sounds: Normal heart sounds. No murmur heard. Pulmonary:     Effort: Pulmonary effort is normal.     Breath sounds: Normal breath sounds. No rales.  Abdominal:      General: Abdomen is flat. Bowel sounds are normal. There is no distension.     Tenderness: There is no abdominal tenderness. There is no guarding.  Musculoskeletal:     Right lower leg: No edema.     Left lower leg: No edema.  Skin:    General: Skin is warm and dry.  Neurological:     Mental Status: He is alert and oriented to person, place, and time.  Psychiatric:        Mood and Affect: Mood normal.        Assessment & Plan:  Christopher Hendricks is a 76 y.o. male . Elevated bilirubin - Plan: Bilirubin, neonatal (fractionated - tot/dir/indir), Pathologist smear review, Retic, CBC  -Bilateral alcohol decreased, declined any resources at this time.  Check updated labs, question Gilberts syndrome as cause of isolated bilirubinemia.  Check screening labs above.  Hyperlipidemia, unspecified hyperlipidemia type - Plan: Comprehensive metabolic panel, Lipid panel  -Elevated ASCVD risk score, not currently on meds.  Recheck labs, commended on diet changes.  Option of coronary calcium scoring to assist with decision on meds.  Alcohol use As above, commended on decreased use  Nocturia Persistent 3 times per night, avoidance of fluids just before bedtime discussed as well as alcohol possible contributor.  However did recommend urology eval given some change in stream over the years although no recent  changes and persistent nocturia, urology referral declined for now.   No orders of the defined types were placed in this encounter.  Patient Instructions  Keep up the good work with cutting back on alcohol, but as we discussed I am happy to provide some resources if you have difficulty cutting back or feel dependent on alcohol. Please let me know if I can help.   I will check labs today and let you know if any concerns. We do have an option of coronary calcium scoring to decide on meds for cholesterol if needed.   Alcohol at night may be contributing to nighttime urination, cutting back on  fluids just before bedtime may be helpful. If nighttime urination continues let me know as I would like you to meet with urology. Let me know if I can refer you.   Thanks for coming in today, take care.     Signed,   Merri Ray, MD [place] 04/04/22 9:32 AM

## 2022-04-04 NOTE — Patient Instructions (Addendum)
Keep up the good work with cutting back on alcohol, but as we discussed I am happy to provide some resources if you have difficulty cutting back or feel dependent on alcohol. Please let me know if I can help.   I will check labs today and let you know if any concerns. We do have an option of coronary calcium scoring to decide on meds for cholesterol if needed.   Alcohol at night may be contributing to nighttime urination, cutting back on fluids just before bedtime may be helpful. If nighttime urination continues let me know as I would like you to meet with urology. Let me know if I can refer you.   Thanks for coming in today, take care.

## 2022-04-05 ENCOUNTER — Encounter: Payer: Self-pay | Admitting: Family Medicine

## 2022-04-08 LAB — RETICULOCYTES
ABS Retic: 54600 cells/uL (ref 25000–90000)
Retic Ct Pct: 1.2 %

## 2022-04-08 LAB — BILIRUBIN, FRACTIONATED(TOT/DIR/INDIR)
Bilirubin, Direct: 0.2 mg/dL (ref 0.0–0.2)
Indirect Bilirubin: 1 mg/dL (calc) (ref 0.2–1.2)
Total Bilirubin: 1.2 mg/dL (ref 0.2–1.2)

## 2022-04-08 LAB — PATHOLOGIST SMEAR REVIEW

## 2022-04-09 ENCOUNTER — Encounter: Payer: Self-pay | Admitting: Family Medicine

## 2022-04-09 DIAGNOSIS — E785 Hyperlipidemia, unspecified: Secondary | ICD-10-CM

## 2022-04-11 MED ORDER — ATORVASTATIN CALCIUM 10 MG PO TABS: 10.0000 mg | ORAL_TABLET | Freq: Every day | ORAL | 0 refills | Status: DC

## 2022-04-24 ENCOUNTER — Encounter: Payer: Self-pay | Admitting: Family Medicine

## 2022-06-13 IMAGING — CR DG TOE GREAT 2+V*R*
3 series · 3 of 3 positions shown · non-contrast
Comparison: None.

CLINICAL DATA: Jammed toe, popping and pain

EXAM:
RIGHT GREAT TOE

[x toes ap right]
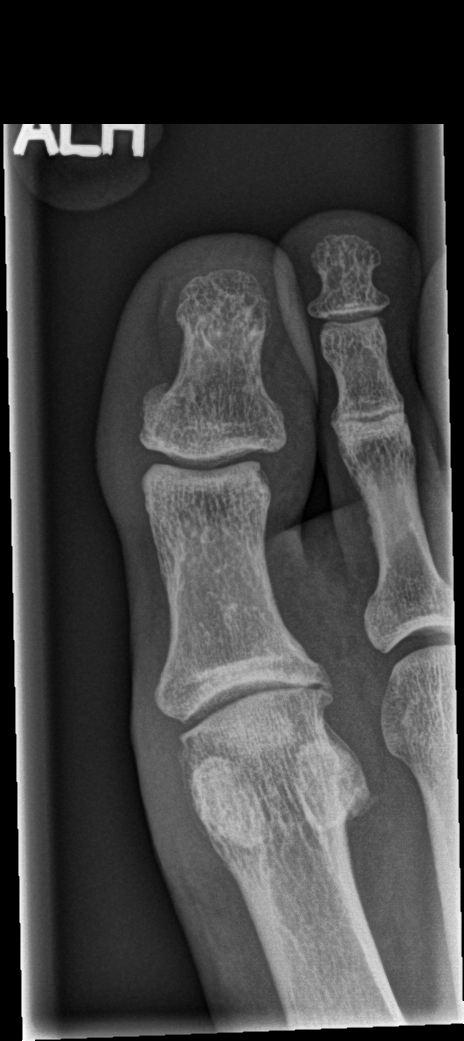

[x toes obl right]
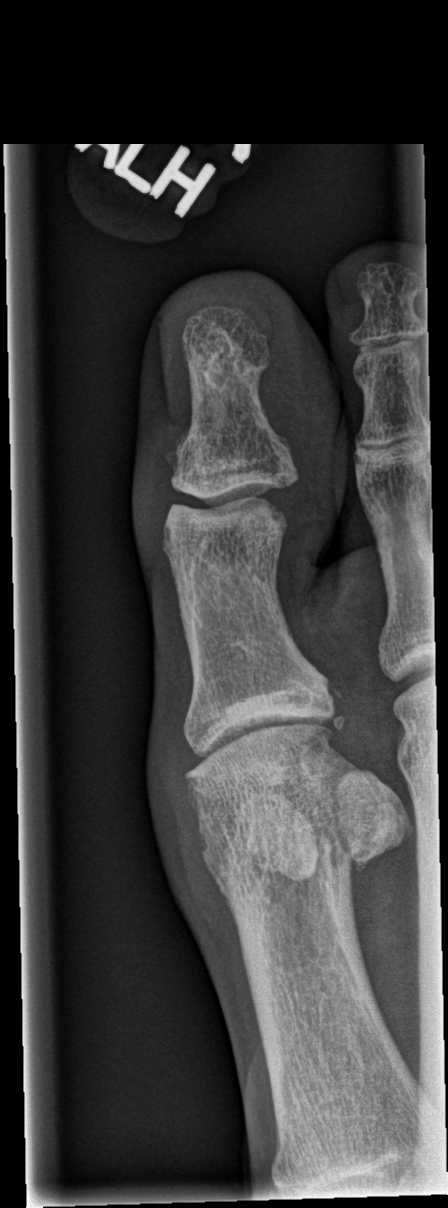

[x toes lat right]
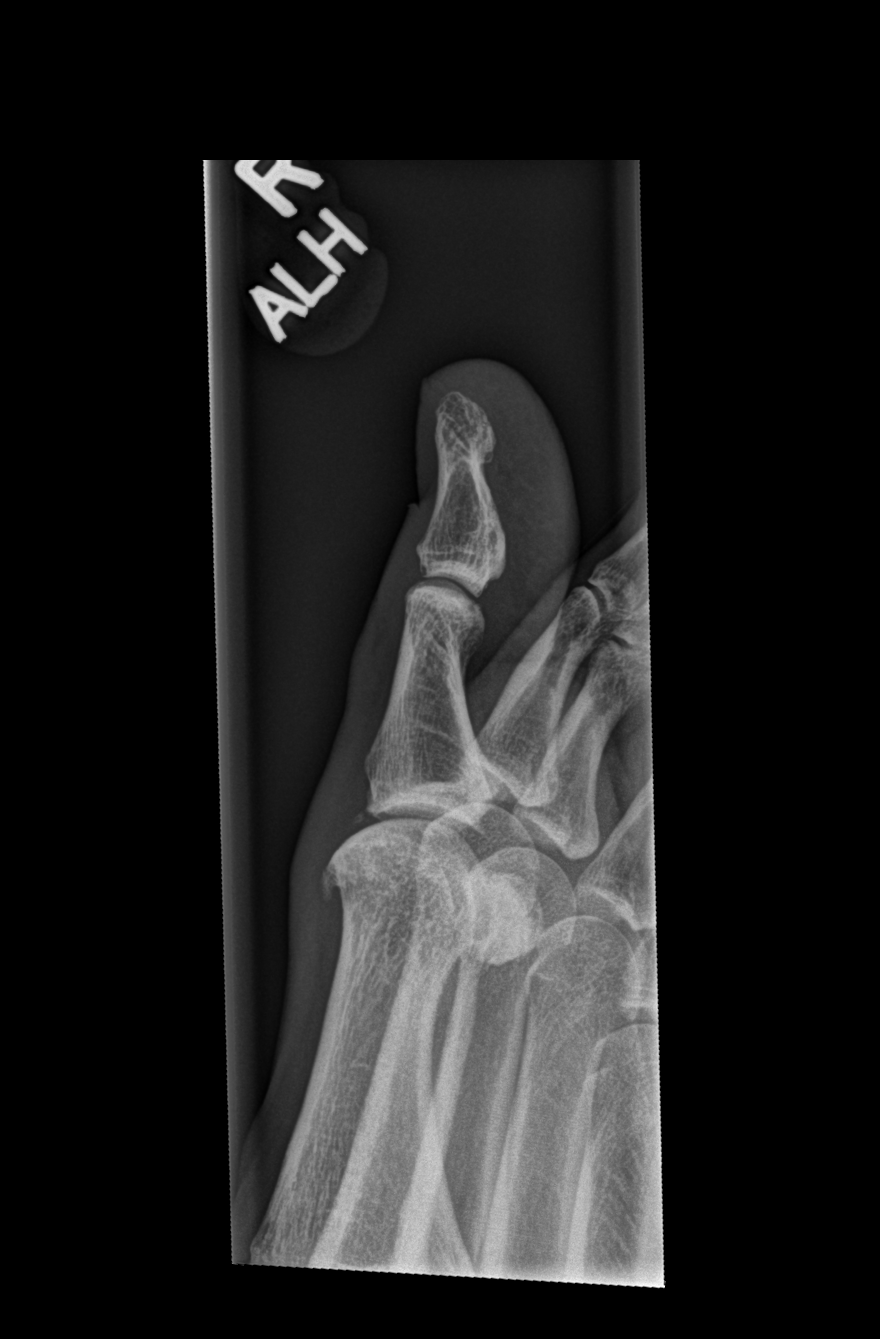

[3 of 3 positions shown; findings below may reference images not displayed]

FINDINGS: No acute fracture or dislocation of the right great toe. There is
mild first metatarsophalangeal arthrosis with small, corticated
appearing osseous fragments at the lateral aspect.
IMPRESSION: No acute fracture or dislocation of the right great toe. There is
mild first metatarsophalangeal arthrosis with small, corticated
appearing degenerative osseous fragments at the lateral aspect. MRI
may be used to assess for ligamentous injury of the great toe (such
as plantar plate injury) if clinically suspected.

## 2022-06-25 ENCOUNTER — Telehealth: Payer: Self-pay | Admitting: Family Medicine

## 2022-06-25 NOTE — Telephone Encounter (Signed)
Contacted Christopher Hendricks to schedule their annual wellness visit. Call back at later date: 07/30/2022  Patient requested call back after 07/28/2022. Patient's a Engineer, maintenance (IT) and it's tax season.  Thank you,  [place] Direct dial  [phone]

## 2022-07-06 ENCOUNTER — Other Ambulatory Visit: Payer: Self-pay | Admitting: Family Medicine

## 2022-07-06 DIAGNOSIS — E785 Hyperlipidemia, unspecified: Secondary | ICD-10-CM

## 2022-07-30 ENCOUNTER — Telehealth: Payer: Self-pay | Admitting: Family Medicine

## 2022-07-30 NOTE — Telephone Encounter (Signed)
Contacted Christopher Hendricks to schedule their annual wellness visit. Appointment made for 07/31/2022.  Thank you,  [facility] Support [facility] Direct dial  [phone]

## 2022-08-06 ENCOUNTER — Telehealth: Payer: Self-pay | Admitting: Family Medicine

## 2022-08-06 NOTE — Telephone Encounter (Signed)
Contacted Haze Justin Lapiana to schedule their annual wellness visit. Appointment made for 08/20/2022.  Thank you,  [facility] Support [facility] Direct dial  [phone]

## 2022-08-20 ENCOUNTER — Ambulatory Visit (INDEPENDENT_AMBULATORY_CARE_PROVIDER_SITE_OTHER): Payer: PPO | Admitting: *Deleted

## 2022-08-20 DIAGNOSIS — Z0000 Encounter for general adult medical examination without abnormal findings: Secondary | ICD-10-CM

## 2022-08-20 NOTE — Progress Notes (Signed)
Subjective:   Christopher Hendricks is a 77 y.o. male who presents for Medicare Annual/Subsequent preventive examination.  I connected with  Christopher Hendricks on 08/20/22 by a telephone enabled telemedicine application and verified that I am speaking with the correct person using two identifiers.   I discussed the limitations of evaluation and management by telemedicine. The patient expressed understanding and agreed to proceed.  Patient location: home  Provider location: telephone/home    Review of Systems     Cardiac Risk Factors include: advanced age (>58men, >94 women);male gender     Objective:    Today's Vitals   There is no height or weight on file to calculate BMI.     08/20/2022    9:48 AM 12/05/2021   10:24 PM 04/03/2021    8:17 AM 03/21/2020    9:52 AM 04/04/2019   10:07 AM 09/17/2018   10:02 AM 09/13/2018    9:33 AM  Advanced Directives  Does Patient Have a Medical Advance Directive? Yes No No;Yes Yes Yes Yes Yes  Type of Estate agent of [employer] Power of [place];Living will;Out of facility DNR (pink MOST or yellow form) Healthcare Power of [employer] of [place];Living will Healthcare Power of [place];Living will  Copy of Healthcare Power of Attorney in Chart? Yes - validated most recent copy scanned in chart (See row information)   Yes - validated most recent copy scanned in chart (See row information) Yes - validated most recent copy scanned in chart (See row information) No - copy requested   Would patient like information on creating a medical advance directive?  No - Patient declined         Current Medications (verified) Outpatient Encounter Medications as of 08/20/2022  Medication Sig   Acetaminophen (TYLENOL PO) Take by mouth at bedtime. 250mg    Ascorbic Acid (VITAMIN C) 100 MG tablet Take 100 mg by mouth daily.   atorvastatin (LIPITOR) 10 MG tablet Take 1 tablet by mouth once daily   diphenhydrAMINE  (BENADRYL) 25 MG tablet Take 25 mg by mouth at bedtime.    Melatonin 5 MG TABS Take 10 mg by mouth at bedtime.   Multiple Vitamins-Minerals (MULTIVITAMIN PO) Take by mouth daily. Tablet Preservision   omeprazole (PRILOSEC) 20 MG capsule Take 20 mg by mouth daily.   POTASSIUM GLUCONATE PO Take 550 mg by mouth every other day.    Zinc 30 MG TABS Take 50 mg by mouth every other day.    Glucosamine-Chondroit-Vit C-Mn (GLUCOSAMINE CHONDR 500 COMPLEX PO) Take 1 tablet by mouth daily. (Patient not taking: Reported on 10/03/2021)   Facility-Administered Encounter Medications as of 08/20/2022  Medication   0.9 %  sodium chloride infusion    Allergies (verified) Codeine and Sulfa antibiotics   History: Past Medical History:  Diagnosis Date   Alcoholism /alcohol abuse    Allergy    Arthritis    Blood transfusion without reported diagnosis    Duodenal ulcer    GERD (gastroesophageal reflux disease)    Heart murmur    as teen   Hyperlipidemia    Phreesia 03/18/2020   Neuromuscular disorder (HCC)    pinched nerve in neck that causes numbness in fingers on right hand    Right inguinal hernia 09/17/2018   Sigmoid diverticulosis 2007   Past Surgical History:  Procedure Laterality Date   COLONOSCOPY  2007   sigmoid diverticulosis.  Dr Russella Dar   ESOPHAGOGASTRODUODENOSCOPY N/A 08/18/2012   Procedure: ESOPHAGOGASTRODUODENOSCOPY (EGD);  Surgeon: Hilarie Fredrickson, MD;  Location[facility] ENDOSCOPY;  Service: Endoscopy;  Laterality: N/A;   HERNIA REPAIR     age 47   INGUINAL HERNIA REPAIR Right 09/17/2018   Procedure: LAPAROSCOPIC REPAIR RIGHT INGUINAL HERNIA;  Surgeon: Claud Kelp, MD;  Location: [facility];  Service: General;  Laterality: Right;   SMALL INTESTINE SURGERY     TONSILLECTOMY     age 46   UPPER GASTROINTESTINAL ENDOSCOPY     VASECTOMY     Family History  Adopted: Yes  Problem Relation Age of Onset   Diabetes Daughter    Colon cancer Neg Hx    Social History    Socioeconomic History   Marital status: Married    Spouse name: Not on file   Number of children: 1   Years of education: Not on file   Highest education level: Master's degree (e.g., MA, MS, MEng, MEd, MSW, MBA)  Occupational History   Occupation: IT trainer  Tobacco Use   Smoking status: Never   Smokeless tobacco: Never  Vaping Use   Vaping Use: Never used  Substance and Sexual Activity   Alcohol use: Yes    Comment: 3-4 glasses wine a day    Drug use: Yes    Types: Marijuana    Comment: pt states he takes 1 puff a night- occasionally   Sexual activity: Not Currently    Comment: number of sex partners in the last 12 months  1 ([name])  Other Topics Concern   Not on file  Social History Narrative   Exercise walking 2 times a week for 1 mile   Married   IT trainer   MBA   Social Determinants of Health   Financial Resource Strain: Low Risk  (08/20/2022)   Overall Financial Resource Strain (CARDIA)    Difficulty of Paying Living Expenses: Not very hard  Food Insecurity: No Food Insecurity (08/20/2022)   Hunger Vital Sign    Worried About Running Out of Food in the Last Year: Never true    Ran Out of Food in the Last Year: Never true  Transportation Needs: No Transportation Needs (08/20/2022)   PRAPARE - Administrator, Civil Service (Medical): No    Lack of Transportation (Non-Medical): No  Physical Activity: Sufficiently Active (08/20/2022)   Exercise Vital Sign    Days of Exercise per Week: 4 days    Minutes of Exercise per Session: 40 min  Stress: No Stress Concern Present (08/20/2022)   [employer] of Occupational Health - Occupational Stress Questionnaire    Feeling of Stress : Not at all  Social Connections: Moderately Isolated (08/20/2022)   Social Connection and Isolation Panel [NHANES]    Frequency of Communication with Friends and Family: Three times a week    Frequency of Social Gatherings with Friends and Family: Three times a week    Attends Religious  Services: Never    Active Member of Clubs or Organizations: No    Attends Engineer, structural: Never    Marital Status: Married    Tobacco Counseling Counseling given: Not Answered   Clinical Intake:  Pre-visit preparation completed: Yes  Pain : No/denies pain     Diabetes: No  How often do you need to have someone help you when you read instructions, pamphlets, or other written materials from your doctor or pharmacy?: 1 - Never  Diabetic?  no  Interpreter Needed?: No  Information entered by :: Remi Haggard LPN   Activities of  Daily Living    08/20/2022    9:49 AM  In your present state of health, do you have any difficulty performing the following activities:  Hearing? 0  Vision? 0  Difficulty concentrating or making decisions? 0  Walking or climbing stairs? 0  Dressing or bathing? 0  Doing errands, shopping? 0  Preparing Food and eating ? N  Using the Toilet? N  In the past six months, have you accidently leaked urine? N  Do you have problems with loss of bowel control? N  Managing your Medications? N  Managing your Finances? N  Housekeeping or managing your Housekeeping? N    Patient Care Team: Shade Flood, MD as PCP - General (Family Medicine) Meryl Dare, MD as Consulting Physician (Gastroenterology) Aris Lot, MD as Consulting Physician (Dermatology)  Indicate any recent Medical Services you may have received from other than [facility] providers in the past year (date may be approximate).     Assessment:   This is a routine wellness examination for [place].  Hearing/Vision screen Hearing Screening - Comments:: No trouble hearing Vision Screening - Comments:: Up to date [place] [employer] of name  Dietary issues and exercise activities discussed: Current Exercise Habits: Home exercise routine, Type of exercise: walking, Time (Minutes): 35, Frequency (Times/Week): 4, Weekly Exercise (Minutes/Week): 140, Intensity:  Mild   Goals Addressed             This Visit's Progress    Patient Stated       Continue current lifestyle       Depression Screen    08/20/2022    9:57 AM 08/20/2022    9:56 AM 04/04/2022    8:38 AM 12/13/2021   11:40 AM 10/03/2021    8:44 AM 04/03/2021    8:15 AM 09/19/2020    8:13 AM  PHQ 2/9 Scores  PHQ - 2 Score 0 0 0 0 0 0 0  PHQ- 9 Score 1 1 0 3  1 0    Fall Risk    08/20/2022    9:47 AM 08/20/2022    9:43 AM 04/04/2022    8:39 AM 12/13/2021   11:41 AM 10/03/2021    8:44 AM  Fall Risk   Falls in the past year? 1 1 0 1 0  Number falls in past yr: 1 1 0 0 0  Injury with Fall? 1 1 0 1 0  Risk for fall due to : History of fall(s)  No Fall Risks History of fall(s) No Fall Risks  Follow up Falls evaluation completed;Education provided;Falls prevention discussed Falls evaluation completed;Education provided;Falls prevention discussed Falls evaluation completed Falls evaluation completed Falls evaluation completed    FALL RISK PREVENTION PERTAINING TO THE HOME:  Any stairs in or around the home? Yes  If so, are there any without handrails? No  Home free of loose throw rugs in walkways, pet beds, electrical cords, etc? Yes  Adequate lighting in your home to reduce risk of falls? Yes   ASSISTIVE DEVICES UTILIZED TO PREVENT FALLS:  Life alert? No  Use of a cane, walker or w/c? No  Grab bars in the bathroom? Yes  Shower chair or bench in shower? No  Elevated toilet seat or a handicapped toilet? No   TIMED UP AND GO:  Was the test performed? No .    Cognitive Function:        08/20/2022    9:50 AM 04/03/2021    8:18 AM 03/21/2020  9:46 AM 04/04/2019   10:14 AM 03/31/2018    8:08 AM  6CIT Screen  What Year? 0 points 0 points 0 points 0 points 0 points  What month? 0 points 0 points 0 points 0 points 0 points  What time? 0 points 0 points 0 points 0 points 0 points  Count back from 20 0 points 0 points 0 points 0 points 0 points  Months in reverse 0 points 0  points 0 points 0 points 0 points  Repeat phrase 0 points 0 points 0 points 0 points 0 points  Total Score 0 points 0 points 0 points 0 points 0 points    Immunizations Immunization History  Administered Date(s) Administered   Fluad Quad(high Dose 65+) 03/21/2020   Influenza Split 02/24/2012   Influenza,inj,Quad PF,6+ Mos 03/01/2013, 03/10/2014, 03/01/2015, 03/13/2016, 03/18/2017   Influenza-Unspecified 02/16/2019, 01/28/2021   PFIZER(Purple Top)SARS-COV-2 Vaccination 05/19/2019, 06/13/2019, 03/02/2020   Pneumococcal Conjugate-13 03/01/2015   Pneumococcal Polysaccharide-23 08/18/2012, 03/16/2019   Tdap 12/05/2021   Zoster, Live 09/18/2012   Zoster, Unspecified 02/22/2013    TDAP status: Up to date  Flu Vaccine status: Up to date  Pneumococcal vaccine status: Up to date  Covid-19 vaccine status: Information provided on how to obtain vaccines.   Qualifies for Shingles Vaccine? Yes   Zostavax completed Yes   Shingrix Completed?: No.    Education has been provided regarding the importance of this vaccine. Patient has been advised to call insurance company to determine out of pocket expense if they have not yet received this vaccine. Advised may also receive vaccine at local pharmacy or Health Dept. Verbalized acceptance and understanding.  Screening Tests Health Maintenance  Topic Date Due   Zoster Vaccines- Shingrix (1 of 2) 03/08/1965   COVID-19 Vaccine (4 - 2023-24 season) 09/05/2022 (Originally 12/13/2021)   INFLUENZA VACCINE  11/13/2022   Medicare Annual Wellness (AWV)  08/20/2023   COLONOSCOPY (Pts 45-55yrs Insurance coverage will need to be confirmed)  08/13/2024   DTaP/Tdap/Td (2 - Td or Tdap) 12/06/2031   Pneumonia Vaccine 27+ Years old  Completed   Hepatitis C Screening  Completed   HPV VACCINES  Aged Out    Health Maintenance  Health Maintenance Due  Topic Date Due   Zoster Vaccines- Shingrix (1 of 2) 03/08/1965    Colorectal cancer screening: Type of  screening: Colonoscopy. Completed 2023. Repeat every 3 years  Lung Cancer Screening: (Low Dose CT Chest recommended if Age 89-80 years, 30 pack-year currently smoking OR have quit w/in 15years.) does not qualify.   Lung Cancer Screening Referral:   Additional Screening:  Hepatitis C Screening: does not qualify; Completed 2017  Vision Screening: Recommended annual ophthalmology exams for early detection of glaucoma and other disorders of the eye. Is the patient up to date with their annual eye exam?  no Who is the provider or what is the name of the office in which the patient attends annual eye exams?  If pt is not established with a provider, would they like to be referred to a provider to establish care?    .   Dental Screening: Recommended annual dental exams for proper oral hygiene  Community Resource Referral / Chronic Care Management: CRR required this visit?  No   CCM required this visit?  No      Plan:     I have personally reviewed and noted the following in the patient's chart:   Medical and social history Use of alcohol, tobacco or illicit drugs  Current  medications and supplements including opioid prescriptions. Patient is not currently taking opioid prescriptions. Functional ability and status Nutritional status Physical activity Advanced directives List of other physicians Hospitalizations, surgeries, and ER visits in previous 12 months Vitals Screenings to include cognitive, depression, and falls Referrals and appointments  In addition, I have reviewed and discussed with patient certain preventive protocols, quality metrics, and best practice recommendations. A written personalized care plan for preventive services as well as general preventive health recommendations were provided to patient.     Remi Haggard, LPN   07/19/4257   Nurse Notes:

## 2022-08-21 DIAGNOSIS — L259 Unspecified contact dermatitis, unspecified cause: Secondary | ICD-10-CM | POA: Diagnosis not present

## 2022-08-21 DIAGNOSIS — E785 Hyperlipidemia, unspecified: Secondary | ICD-10-CM | POA: Diagnosis not present

## 2022-08-21 DIAGNOSIS — K219 Gastro-esophageal reflux disease without esophagitis: Secondary | ICD-10-CM | POA: Diagnosis not present

## 2022-08-21 DIAGNOSIS — M5136 Other intervertebral disc degeneration, lumbar region: Secondary | ICD-10-CM | POA: Diagnosis not present

## 2022-08-21 DIAGNOSIS — M1990 Unspecified osteoarthritis, unspecified site: Secondary | ICD-10-CM | POA: Diagnosis not present

## 2022-08-21 DIAGNOSIS — G8929 Other chronic pain: Secondary | ICD-10-CM | POA: Diagnosis not present

## 2022-09-04 ENCOUNTER — Telehealth: Payer: Self-pay | Admitting: Family Medicine

## 2022-09-04 NOTE — Telephone Encounter (Signed)
House Calls Visit Summary - Wyvonne Lenz placed in front bin

## 2022-10-01 ENCOUNTER — Other Ambulatory Visit: Payer: Self-pay | Admitting: Family Medicine

## 2022-10-01 DIAGNOSIS — E785 Hyperlipidemia, unspecified: Secondary | ICD-10-CM

## 2022-10-08 ENCOUNTER — Encounter: Payer: Self-pay | Admitting: Family Medicine

## 2022-10-08 ENCOUNTER — Ambulatory Visit: Payer: PPO | Admitting: Family Medicine

## 2022-10-08 DIAGNOSIS — E785 Hyperlipidemia, unspecified: Secondary | ICD-10-CM

## 2022-10-08 NOTE — Telephone Encounter (Signed)
FYI  Called patient and he is leaving due to distance and his own feelings about missing appointments I did ask that he call us if he needs anything in the mean time

## 2022-10-10 NOTE — Telephone Encounter (Signed)
Patient sent a message asking if he could still see [place], I called the patient and made an appointment for Friday 10/24/2022 and patient promises he will not miss this one.   No action required this is an [place]

## 2022-10-17 DIAGNOSIS — L578 Other skin changes due to chronic exposure to nonionizing radiation: Secondary | ICD-10-CM | POA: Diagnosis not present

## 2022-10-17 DIAGNOSIS — L570 Actinic keratosis: Secondary | ICD-10-CM | POA: Diagnosis not present

## 2022-10-17 DIAGNOSIS — L814 Other melanin hyperpigmentation: Secondary | ICD-10-CM | POA: Diagnosis not present

## 2022-10-17 DIAGNOSIS — L821 Other seborrheic keratosis: Secondary | ICD-10-CM | POA: Diagnosis not present

## 2022-10-17 DIAGNOSIS — D1801 Hemangioma of skin and subcutaneous tissue: Secondary | ICD-10-CM | POA: Diagnosis not present

## 2022-10-17 DIAGNOSIS — D485 Neoplasm of uncertain behavior of skin: Secondary | ICD-10-CM | POA: Diagnosis not present

## 2022-10-17 DIAGNOSIS — L820 Inflamed seborrheic keratosis: Secondary | ICD-10-CM | POA: Diagnosis not present

## 2022-10-17 DIAGNOSIS — D225 Melanocytic nevi of trunk: Secondary | ICD-10-CM | POA: Diagnosis not present

## 2022-10-20 ENCOUNTER — Telehealth: Payer: Self-pay

## 2022-10-20 DIAGNOSIS — E785 Hyperlipidemia, unspecified: Secondary | ICD-10-CM

## 2022-10-20 NOTE — Telephone Encounter (Signed)
CMP and lipids future ordered.

## 2022-10-20 NOTE — Telephone Encounter (Signed)
Pt had to reschedule his follow up apt . He would like to come in for blood work prior to his apt on 24th ? What lab work would you like to order ?

## 2022-10-21 NOTE — Telephone Encounter (Signed)
Noted  

## 2022-10-22 NOTE — Progress Notes (Deleted)
Please see telephone message 10/20/22 - I ordered CMP and Lipid panel - for lab visit prior to his scheduled appointment. I will send him a message as well.

## 2022-10-24 ENCOUNTER — Ambulatory Visit: Payer: PPO | Admitting: Family Medicine

## 2022-10-24 ENCOUNTER — Other Ambulatory Visit: Payer: PPO

## 2022-10-24 ENCOUNTER — Other Ambulatory Visit (INDEPENDENT_AMBULATORY_CARE_PROVIDER_SITE_OTHER): Payer: PPO

## 2022-10-24 DIAGNOSIS — E785 Hyperlipidemia, unspecified: Secondary | ICD-10-CM

## 2022-10-24 LAB — COMPREHENSIVE METABOLIC PANEL
ALT: 41 U/L (ref 0–53)
AST: 33 U/L (ref 0–37)
Albumin: 4.5 g/dL (ref 3.5–5.2)
Alkaline Phosphatase: 65 U/L (ref 39–117)
BUN: 13 mg/dL (ref 6–23)
CO2: 28 mEq/L (ref 19–32)
Calcium: 9.4 mg/dL (ref 8.4–10.5)
Chloride: 105 mEq/L (ref 96–112)
Creatinine, Ser: 0.83 mg/dL (ref 0.40–1.50)
GFR: 85.54 mL/min (ref >60.00)
Glucose, Bld: 86 mg/dL (ref 70–99)
Potassium: 4 mEq/L (ref 3.5–5.1)
Sodium: 142 mEq/L (ref 135–145)
Total Bilirubin: 1.7 mg/dL — ABNORMAL HIGH (ref 0.2–1.2)
Total Protein: 6.9 g/dL (ref 6.0–8.3)

## 2022-10-24 LAB — LIPID PANEL
Cholesterol: 137 mg/dL (ref 0–200)
HDL: 38.9 mg/dL — ABNORMAL LOW (ref >39.00)
LDL Cholesterol: 81 mg/dL (ref 0–99)
NonHDL: 98.06
Total CHOL/HDL Ratio: 4
Triglycerides: 85 mg/dL (ref 0.0–149.0)
VLDL: 17 mg/dL (ref 0.0–40.0)

## 2022-11-05 ENCOUNTER — Encounter: Payer: Self-pay | Admitting: Family Medicine

## 2022-11-05 ENCOUNTER — Ambulatory Visit (INDEPENDENT_AMBULATORY_CARE_PROVIDER_SITE_OTHER): Payer: PPO | Admitting: Family Medicine

## 2022-11-05 VITALS — BP 118/60 | HR 58 | Temp 98.3°F | Ht 71.5 in | Wt 146.8 lb

## 2022-11-05 DIAGNOSIS — E785 Hyperlipidemia, unspecified: Secondary | ICD-10-CM

## 2022-11-05 DIAGNOSIS — R351 Nocturia: Secondary | ICD-10-CM | POA: Diagnosis not present

## 2022-11-05 DIAGNOSIS — R17 Unspecified jaundice: Secondary | ICD-10-CM | POA: Diagnosis not present

## 2022-11-05 MED ORDER — ATORVASTATIN CALCIUM 10 MG PO TABS: 10.0000 mg | ORAL_TABLET | Freq: Every day | ORAL | 1 refills | Status: DC

## 2022-11-05 NOTE — Patient Instructions (Addendum)
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 I do not recommend any further testing.  Keep up the good work with watching alcohol intake.  No change in meds at this time - cholesterol looks better.  Follow-up in 6 months but if you have any questions in meantime let me know.  Take care.

## 2022-11-05 NOTE — Progress Notes (Signed)
Subjective:  Patient ID: Christopher Hendricks, male    DOB: 1945/09/25  Age: 77 y.o. MRN: [id number]  CC:  Chief Complaint  Patient presents with   Medical Management of Chronic Issues    Doing well    HPI Christopher Hendricks presents for   Hyperlipidemia: With slight elevated bilirubin previously.  Other LFTs normal prior.  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  Alcohol use in the past, decreased amounts when discussed in December.  2 glasses of wine per night.  Improved diet at that visit.  Denies jaundice or scleral icterus. Lipitor 10mg  every day in evening. No new myalgias. Occasional leg cramps. Improves with hydration.  No new symptoms. Recent labs.  Fish most days, exercising daily with walking.  1 alcohol drink per night.   Lab Results  Component Value Date   CHOL 137 10/24/2022   HDL 38.90 (L) 10/24/2022   LDLCALC 81 10/24/2022   TRIG 85.0 10/24/2022   CHOLHDL 4 10/24/2022   Lab Results  Component Value Date   ALT 41 10/24/2022   AST 33 10/24/2022   ALKPHOS 65 10/24/2022   BILITOT 1.7 (H) 10/24/2022   Nocturia Persistent 3 episodes per night at last visit.  Recommended avoidance of fluids just before bedtime and discussed alcohol as a possible contributor as well.  He did note some change in stream over the years, possible BPH component, urology referral recommended but was declined as of last visit.  Still nocturia few times per night.   Some decreased fluid before bedtime. No hesitancy, change in stream, dysuria or hematuria.  Declines meds or urology eval at this time. Considering psa in December at follow up.   Shingles vaccine at pharmacy.  Covid booster - plans in fall with flu vaccine.  Had Prevnar 20 and RSV - clinical trial.  Immunization History  Administered Date(s) Administered   Fluad Quad(high Dose 65+) 03/21/2020   Influenza Split 02/24/2012   Influenza,inj,Quad PF,6+ Mos 03/01/2013, 03/10/2014, 03/01/2015, 03/13/2016, 03/18/2017    Influenza-Unspecified 02/16/2019, 01/28/2021   PFIZER(Purple Top)SARS-COV-2 Vaccination 05/19/2019, 06/13/2019, 03/02/2020   Pneumococcal Conjugate-13 03/01/2015   Pneumococcal Polysaccharide-23 08/18/2012, 03/16/2019   Tdap 12/05/2021   Zoster, Live 09/18/2012   Zoster, Unspecified 02/22/2013            History Patient Active Problem List   Diagnosis Date Noted   Anemia 08/17/2012   Alcohol abuse 08/17/2012   Past Medical History:  Diagnosis Date   Alcoholism /alcohol abuse    Allergy    Arthritis    Blood transfusion without reported diagnosis    Duodenal ulcer    GERD (gastroesophageal reflux disease)    Heart murmur    as teen   Hyperlipidemia    Phreesia 03/18/2020   Neuromuscular disorder (HCC)    pinched nerve in neck that causes numbness in fingers on right hand    Right inguinal hernia 09/17/2018   Sigmoid diverticulosis 2007   Past Surgical History:  Procedure Laterality Date   COLONOSCOPY  2007   sigmoid diverticulosis.  Dr Russella Dar   ESOPHAGOGASTRODUODENOSCOPY N/A 08/18/2012   Procedure: ESOPHAGOGASTRODUODENOSCOPY (EGD);  Surgeon: Hilarie Fredrickson, MD;  Location: [facility] ENDOSCOPY;  Service: Endoscopy;  Laterality: N/A;   HERNIA REPAIR     age 71   INGUINAL HERNIA REPAIR Right 09/17/2018   Procedure: LAPAROSCOPIC REPAIR RIGHT INGUINAL HERNIA;  Surgeon: Claud Kelp, MD;  Location: [facility];  Service: General;  Laterality: Right;   SMALL INTESTINE SURGERY  TONSILLECTOMY     age 71   UPPER GASTROINTESTINAL ENDOSCOPY     VASECTOMY     Allergies  Allergen Reactions   Codeine Nausea And Vomiting and Other (See Comments)   Sulfa Antibiotics Rash    Other reaction(s): Unknown   Prior to Admission medications   Medication Sig Start Date End Date Taking? Authorizing Provider  Acetaminophen (TYLENOL PO) Take by mouth at bedtime. 250mg    Yes [provider]  Ascorbic Acid (VITAMIN C) 100 MG tablet Take 100 mg by mouth daily.   Yes  [provider]  atorvastatin (LIPITOR) 10 MG tablet Take 1 tablet by mouth once daily 10/01/22  Yes Shade Flood, MD  diphenhydrAMINE (BENADRYL) 25 MG tablet Take 25 mg by mouth at bedtime.    Yes [provider]  Melatonin 5 MG TABS Take 10 mg by mouth at bedtime.   Yes [provider]  Multiple Vitamins-Minerals (MULTIVITAMIN PO) Take by mouth daily. Tablet Preservision   Yes [provider]  omeprazole (PRILOSEC) 20 MG capsule Take 20 mg by mouth daily.   Yes [provider]  POTASSIUM GLUCONATE PO Take 550 mg by mouth every other day.    Yes [provider]  Zinc 30 MG TABS Take 50 mg by mouth every other day.    Yes [provider]  Glucosamine-Chondroit-Vit C-Mn (GLUCOSAMINE CHONDR 500 COMPLEX PO) Take 1 tablet by mouth daily. Patient not taking: Reported on 10/03/2021    [provider]   Social History   Socioeconomic History   Marital status: Married    Spouse name: Not on file   Number of children: 1   Years of education: Not on file   Highest education level: Master's degree (e.g., MA, MS, MEng, MEd, MSW, MBA)  Occupational History   Occupation: IT trainer  Tobacco Use   Smoking status: Never   Smokeless tobacco: Never  Vaping Use   Vaping status: Never Used  Substance and Sexual Activity   Alcohol use: Yes    Comment: 3-4 glasses wine a day    Drug use: Yes    Types: Marijuana    Comment: pt states he takes 1 puff a night- occasionally   Sexual activity: Not Currently    Comment: number of sex partners in the last 12 months  1 ([name])  Other Topics Concern   Not on file  Social History Narrative   Exercise walking 2 times a week for 1 mile   Married   IT trainer   MBA   Social Determinants of Health   Financial Resource Strain: Low Risk  (08/20/2022)   Overall Financial Resource Strain (CARDIA)    Difficulty of Paying Living Expenses: Not very hard  Food Insecurity: No Food Insecurity (08/20/2022)    Hunger Vital Sign    Worried About Running Out of Food in the Last Year: Never true    Ran Out of Food in the Last Year: Never true  Transportation Needs: No Transportation Needs (08/20/2022)   PRAPARE - Administrator, Civil Service (Medical): No    Lack of Transportation (Non-Medical): No  Physical Activity: Sufficiently Active (08/20/2022)   Exercise Vital Sign    Days of Exercise per Week: 4 days    Minutes of Exercise per Session: 40 min  Stress: No Stress Concern Present (08/20/2022)   [employer] of Occupational Health - Occupational Stress Questionnaire    Feeling of Stress : Not at all  Social Connections: Moderately Isolated (  08/20/2022)   Social Connection and Isolation Panel [NHANES]    Frequency of Communication with Friends and Family: Three times a week    Frequency of Social Gatherings with Friends and Family: Three times a week    Attends Religious Services: Never    Active Member of Clubs or Organizations: No    Attends Banker Meetings: Never    Marital Status: Married  Catering manager Violence: Not At Risk (08/20/2022)   Humiliation, Afraid, Rape, and Kick questionnaire    Fear of Current or Ex-Partner: No    Emotionally Abused: No    Physically Abused: No    Sexually Abused: No    Review of Systems  Constitutional:  Negative for fatigue and unexpected weight change.  Eyes:  Negative for visual disturbance.  Respiratory:  Negative for cough, chest tightness and shortness of breath.   Cardiovascular:  Negative for chest pain, palpitations and leg swelling.  Gastrointestinal:  Negative for abdominal pain and blood in stool.  Neurological:  Negative for dizziness, light-headedness and headaches.     Objective:   Vitals:   11/05/22 1341  BP: 118/60  Pulse: (!) 58  Temp: 98.3 F (36.8 C)  TempSrc: Temporal  SpO2: 98%  Weight: 146 lb 12.8 oz (66.6 kg)  Height: 5' 11.5" (1.816 m)     Physical Exam Vitals reviewed.   Constitutional:      Appearance: He is well-developed.  HENT:     Head: Normocephalic and atraumatic.  Eyes:     Comments: No scleral icterus.  Neck:     Vascular: No carotid bruit or JVD.  Cardiovascular:     Rate and Rhythm: Normal rate and regular rhythm.     Heart sounds: Normal heart sounds. No murmur heard. Pulmonary:     Effort: Pulmonary effort is normal.     Breath sounds: Normal breath sounds. No rales.  Musculoskeletal:     Right lower leg: No edema.     Left lower leg: No edema.  Skin:    General: Skin is warm and dry.     Coloration: Skin is not jaundiced.  Neurological:     Mental Status: He is alert and oriented to person, place, and time.  Psychiatric:        Mood and Affect: Mood normal.        Assessment & Plan:  KOREE Hendricks is a 77 y.o. male . Elevated bilirubin With normal reticulocyte count, CBC, other LFTs normal and peripheral smear normal, suspected Gilbert's syndrome.  No further workup at this time.  Hyperlipidemia, unspecified hyperlipidemia type - Plan: atorvastatin (LIPITOR) 10 MG tablet  -Improved on atorvastatin, tolerating, continue hydration working outside, RTC if persistent or worsening cramps.  Recent labs reassuring.  Nocturia  -Stable, possible BPH.  Declines meds or urology eval at this time.  Would consider PSA at his follow-up visit in 6 months.  RTC precautions.    Meds ordered this encounter  Medications   atorvastatin (LIPITOR) 10 MG tablet    Sig: Take 1 tablet (10 mg total) by mouth daily.    Dispense:  90 tablet    Refill:  1   Patient Instructions  Based on prior testing I think you have Gilberts syndrome as cause of elevated bilirubin. I do not recommend any further testing.  Keep up the good work with watching alcohol intake.  No change in meds at this time - cholesterol looks better.  Follow-up in 6 months but if you have  any questions in meantime let me know.  Take care.       Signed,   Meredith Staggers, MD [facility], [facility] 11/05/22 2:20 PM

## 2023-03-03 ENCOUNTER — Other Ambulatory Visit: Payer: Self-pay | Admitting: Medical Genetics

## 2023-03-03 DIAGNOSIS — Z006 Encounter for examination for normal comparison and control in clinical research program: Secondary | ICD-10-CM

## 2023-03-31 ENCOUNTER — Other Ambulatory Visit (HOSPITAL_COMMUNITY)
Admission: RE | Admit: 2023-03-31 | Discharge: 2023-03-31 | Disposition: A | Discharge status: Home / Self Care | Payer: Self-pay | Source: Ambulatory Visit | Attending: Oncology | Admitting: Oncology

## 2023-03-31 DIAGNOSIS — Z006 Encounter for examination for normal comparison and control in clinical research program: Secondary | ICD-10-CM | POA: Insufficient documentation

## 2023-04-07 LAB — GENECONNECT MOLECULAR SCREEN: Genetic Analysis Overall Interpretation: NEGATIVE

## 2023-05-08 ENCOUNTER — Ambulatory Visit: Payer: PPO | Admitting: Family Medicine

## 2023-05-08 VITALS — BP 116/60 | HR 78 | Temp 98.1°F | Ht 71.5 in | Wt 139.6 lb

## 2023-05-08 DIAGNOSIS — E785 Hyperlipidemia, unspecified: Secondary | ICD-10-CM | POA: Diagnosis not present

## 2023-05-08 DIAGNOSIS — R17 Unspecified jaundice: Secondary | ICD-10-CM

## 2023-05-08 DIAGNOSIS — R351 Nocturia: Secondary | ICD-10-CM

## 2023-05-08 DIAGNOSIS — R634 Abnormal weight loss: Secondary | ICD-10-CM

## 2023-05-08 DIAGNOSIS — L282 Other prurigo: Secondary | ICD-10-CM

## 2023-05-08 LAB — SEDIMENTATION RATE: Sed Rate: 20 mm/h (ref 0–20)

## 2023-05-08 LAB — CBC
HCT: 43.2 % (ref 39.0–52.0)
Hemoglobin: 14.4 g/dL (ref 13.0–17.0)
MCHC: 33.3 g/dL (ref 30.0–36.0)
MCV: 96.3 fL (ref 78.0–100.0)
Platelets: 212 10*3/uL (ref 150.0–400.0)
RBC: 4.49 Mil/uL (ref 4.22–5.81)
RDW: 12.8 % (ref 11.5–15.5)
WBC: 4.7 10*3/uL (ref 4.0–10.5)

## 2023-05-08 LAB — COMPREHENSIVE METABOLIC PANEL
ALT: 46 U/L (ref 0–53)
AST: 33 U/L (ref 0–37)
Albumin: 4.7 g/dL (ref 3.5–5.2)
Alkaline Phosphatase: 77 U/L (ref 39–117)
BUN: 16 mg/dL (ref 6–23)
CO2: 30 meq/L (ref 19–32)
Calcium: 9.4 mg/dL (ref 8.4–10.5)
Chloride: 103 meq/L (ref 96–112)
Creatinine, Ser: 0.78 mg/dL (ref 0.40–1.50)
GFR: 86.84 mL/min (ref >60.00)
Glucose, Bld: 90 mg/dL (ref 70–99)
Potassium: 4.6 meq/L (ref 3.5–5.1)
Sodium: 141 meq/L (ref 135–145)
Total Bilirubin: 1.1 mg/dL (ref 0.2–1.2)
Total Protein: 6.9 g/dL (ref 6.0–8.3)

## 2023-05-08 LAB — LIPID PANEL
Cholesterol: 146 mg/dL (ref 0–200)
HDL: 46 mg/dL (ref >39.00)
LDL Cholesterol: 84 mg/dL (ref 0–99)
NonHDL: 99.99
Total CHOL/HDL Ratio: 3
Triglycerides: 79 mg/dL (ref 0.0–149.0)
VLDL: 15.8 mg/dL (ref 0.0–40.0)

## 2023-05-08 LAB — PSA, MEDICARE: PSA: 0.9 ng/mL (ref 0.10–4.00)

## 2023-05-08 LAB — TSH: TSH: 1.15 u[IU]/mL (ref 0.35–5.50)

## 2023-05-08 MED ORDER — ATORVASTATIN CALCIUM 10 MG PO TABS: 10.0000 mg | ORAL_TABLET | Freq: Every day | ORAL | 1 refills | Status: DC

## 2023-05-08 NOTE — Progress Notes (Unsigned)
Subjective:  Patient ID: Christopher Hendricks, male    DOB: 02/07/1946  Age: 78 y.o. MRN: [id number]  CC:  Chief Complaint  Patient presents with   Medical Management of Chronic Issues    Pt notes doing okay for the most part but he notes he still is losing weight and wondered if there is something he can do to help this     HPI Christopher Hendricks presents for   Rash: Itchy rash every winter. Started about 4 weeks ago - chest back arms and upper legs. Not in hands, feet, no genital lesions.  No new derm products, foods. On probiotic 2 times per week.  Usually improves in spring.  Tx: aloe. Min relief. Gold bond - some relief.  No recent visit with his dermatologist.  Hyperlipidemia: With prior elevated bilirubin.  Previous LFTs were normal otherwise.  Possible Gilbert's syndrome based on previous labs, alcohol use in the past but decreased amounts when discussed at prior visits.  Down to 1 alcohol drink per night at his July 2024 visit.  Exercise daily with walking as of his last visit. Lipitor 10 mg daily. No new side effects.  Still 1 alcoholic drink per day.  Lab Results  Component Value Date   CHOL 137 10/24/2022   HDL 38.90 (L) 10/24/2022   LDLCALC 81 10/24/2022   TRIG 85.0 10/24/2022   CHOLHDL 4 10/24/2022   Lab Results  Component Value Date   ALT 41 10/24/2022   AST 33 10/24/2022   ALKPHOS 65 10/24/2022   BILITOT 1.7 (H) 10/24/2022   Nocturia 3 episodes per night in the past.  Fluid avoidance before bedtime discussed.  Some decreased dream over the years with possible BPH component.  Urology referral has been recommended but declined including a repeat recommendation last visit. 2-3 episodes per night. Sometimes once.  No difficulty w/ initiation. Some dribbling.  Agrees to PSA today and u/a. Recommended meeting with urologist again, declines urology. Depending on PSA would consider.  Lab Results  Component Value Date   PSA1 1.0 03/31/2018   PSA1 1.4 03/18/2017    PSA 1.1 03/13/2016   PSA 1.08 03/10/2014   PSA 0.65 09/08/2012   Weight loss He is down 7 pounds from his last visit in July. No night sweats, no fever.  Eating 3 meals per day with snacks in between.  Last colonoscopy May 2023, Multiple polyps removed, largest polyp precancerous  - repeat 3 yrs. Dr. Russella Dar. No blood in stools or change in stools.  No n/v/abd pain No new cough. Some challenges with spouse and dementia, he is now caregiver for some challenges.  Denies personal depression or SI.  Wt Readings from Last 3 Encounters:  05/08/23 139 lb 9.6 oz (63.3 kg)  11/05/22 146 lb 12.8 oz (66.6 kg)  04/04/22 146 lb (66.2 kg)      History Patient Active Problem List   Diagnosis Date Noted   Anemia 08/17/2012   Alcohol abuse 08/17/2012   Past Medical History:  Diagnosis Date   Alcoholism /alcohol abuse    Allergy    Arthritis    Blood transfusion without reported diagnosis    Duodenal ulcer    GERD (gastroesophageal reflux disease)    Heart murmur    as teen   Hyperlipidemia    Phreesia 03/18/2020   Neuromuscular disorder (HCC)    pinched nerve in neck that causes numbness in fingers on right hand    Right inguinal hernia 09/17/2018   Sigmoid  diverticulosis 2007   Past Surgical History:  Procedure Laterality Date   COLONOSCOPY  2007   sigmoid diverticulosis.  Dr Russella Dar   ESOPHAGOGASTRODUODENOSCOPY N/A 08/18/2012   Procedure: ESOPHAGOGASTRODUODENOSCOPY (EGD);  Surgeon: Hilarie Fredrickson, MD;  Location: [facility] ENDOSCOPY;  Service: Endoscopy;  Laterality: N/A;   HERNIA REPAIR     age 62   INGUINAL HERNIA REPAIR Right 09/17/2018   Procedure: LAPAROSCOPIC REPAIR RIGHT INGUINAL HERNIA;  Surgeon: Claud Kelp, MD;  Location: [facility];  Service: General;  Laterality: Right;   SMALL INTESTINE SURGERY     TONSILLECTOMY     age 89   UPPER GASTROINTESTINAL ENDOSCOPY     VASECTOMY     Allergies  Allergen Reactions   Codeine Nausea And Vomiting and Other (See  Comments)   Sulfa Antibiotics Rash    Other reaction(s): Unknown   Prior to Admission medications   Medication Sig Start Date End Date Taking? Authorizing Provider  Acetaminophen (TYLENOL PO) Take by mouth at bedtime. 250mg    Yes [provider]  Ascorbic Acid (VITAMIN C) 100 MG tablet Take 100 mg by mouth daily. 2 times a week   Yes [provider]  atorvastatin (LIPITOR) 10 MG tablet Take 1 tablet (10 mg total) by mouth daily. Patient taking differently: Take 10 mg by mouth daily. At bedtime 11/05/22  Yes Shade Flood, MD  diphenhydrAMINE (BENADRYL) 25 MG tablet Take 25 mg by mouth at bedtime.    Yes [provider]  Melatonin 5 MG TABS Take 10 mg by mouth at bedtime.   Yes [provider]  Multiple Vitamins-Minerals (MULTIVITAMIN PO) Take by mouth daily. 2 times a week   Yes [provider]  omeprazole (PRILOSEC) 20 MG capsule Take 20 mg by mouth daily.   Yes [provider]  POTASSIUM GLUCONATE PO Take 550 mg by mouth every other day. 2 times a week   Yes [provider]  Zinc 30 MG TABS Take 50 mg by mouth every other day.    Yes [provider]  Glucosamine-Chondroit-Vit C-Mn (GLUCOSAMINE CHONDR 500 COMPLEX PO) Take 1 tablet by mouth daily. Patient not taking: Reported on 10/03/2021    [provider]   Social History   Socioeconomic History   Marital status: Married    Spouse name: Not on file   Number of children: 1   Years of education: Not on file   Highest education level: Master's degree (e.g., MA, MS, MEng, MEd, MSW, MBA)  Occupational History   Occupation: IT trainer  Tobacco Use   Smoking status: Never   Smokeless tobacco: Never  Vaping Use   Vaping status: Never Used  Substance and Sexual Activity   Alcohol use: Yes    Comment: 3-4 glasses wine a day    Drug use: Yes    Types: Marijuana    Comment: pt states he takes 1 puff a night- occasionally   Sexual activity: Not Currently     Comment: number of sex partners in the last 12 months  1 ([name])  Other Topics Concern   Not on file  Social History Narrative   Exercise walking 2 times a week for 1 mile   Married   IT trainer   MBA   Social Drivers of Health   Financial Resource Strain: Low Risk  (08/20/2022)   Overall Financial Resource Strain (CARDIA)    Difficulty of Paying Living Expenses: Not very hard  Food Insecurity: No Food Insecurity (08/20/2022)   Hunger  Vital Sign    Worried About Programme researcher, broadcasting/film/video in the Last Year: Never true    Ran Out of Food in the Last Year: Never true  Transportation Needs: No Transportation Needs (08/20/2022)   PRAPARE - Administrator, Civil Service (Medical): No    Lack of Transportation (Non-Medical): No  Physical Activity: Sufficiently Active (08/20/2022)   Exercise Vital Sign    Days of Exercise per Week: 4 days    Minutes of Exercise per Session: 40 min  Stress: No Stress Concern Present (08/20/2022)   [employer] of Occupational Health - Occupational Stress Questionnaire    Feeling of Stress : Not at all  Social Connections: Moderately Isolated (08/20/2022)   Social Connection and Isolation Panel [NHANES]    Frequency of Communication with Friends and Family: Three times a week    Frequency of Social Gatherings with Friends and Family: Three times a week    Attends Religious Services: Never    Active Member of Clubs or Organizations: No    Attends Banker Meetings: Never    Marital Status: Married  Catering manager Violence: Not At Risk (08/20/2022)   Humiliation, Afraid, Rape, and Kick questionnaire    Fear of Current or Ex-Partner: No    Emotionally Abused: No    Physically Abused: No    Sexually Abused: No    Review of Systems   Objective:   Vitals:   05/08/23 0826  BP: 116/60  Pulse: 78  Temp: 98.1 F (36.7 C)  TempSrc: Temporal  SpO2: 94%  Weight: 139 lb 9.6 oz (63.3 kg)  Height: 5' 11.5" (1.816 m)     Physical Exam Vitals  reviewed.  Constitutional:      Appearance: He is well-developed.     Comments: Thin appearance.  HENT:     Head: Normocephalic and atraumatic.  Neck:     Vascular: No carotid bruit or JVD.  Cardiovascular:     Rate and Rhythm: Normal rate and regular rhythm.     Heart sounds: Normal heart sounds. No murmur heard. Pulmonary:     Effort: Pulmonary effort is normal.     Breath sounds: Normal breath sounds. No rales.  Abdominal:     General: Abdomen is flat.     Palpations: There is no mass.     Tenderness: There is no abdominal tenderness.     Comments: Thin, scaphoid appearance.  Musculoskeletal:     Right lower leg: No edema.     Left lower leg: No edema.  Skin:    General: Skin is warm and dry.     Findings: Rash present.     Comments: Fine papular rash across abdomen, dry skin of back.  See photos.  Spares hands, face.  Neurological:     Mental Status: He is alert and oriented to person, place, and time.  Psychiatric:        Mood and Affect: Mood normal.          Assessment & Plan:  Christopher Hendricks is a 78 y.o. male . Elevated bilirubin  Hyperlipidemia, unspecified hyperlipidemia type - Plan: atorvastatin (LIPITOR) 10 MG tablet, Comprehensive metabolic panel, Lipid panel  Nocturia - Plan: CANCELED: PSA  Weight loss - Plan: CBC, Sedimentation rate, TSH  Pruritic rash - Plan: Sedimentation rate   Meds ordered this encounter  Medications   atorvastatin (LIPITOR) 10 MG tablet    Sig: Take 1 tablet (10 mg total) by mouth daily.  Dispense:  90 tablet    Refill:  1   Patient Instructions  Rash during the winter could be related to dry skin.  Make sure to use a hydrating soap like Dove for men and apply lotion after bathing.  Eucerin or Aveeno are good options.  You can use that lotion a few times throughout the day as well.  If that is not helping at next visit we can discuss possible dermatology eval or other treatments.  I will check some labs today  including a prostate test and other labs to look at other causes weight loss.  No change in medications for now.  Follow-up with me in 2 weeks and we can discuss these lab results and other testing further.  Please let me know if there are questions or any new symptoms in the meantime.  I appreciate you coming in today.  Hang in there.    Signed,   Meredith Staggers, MD [facility], [facility] 05/08/23 9:24 AM

## 2023-05-08 NOTE — Patient Instructions (Signed)
Rash during the winter could be related to dry skin.  Make sure to use a hydrating soap like Dove for men and apply lotion after bathing.  Eucerin or Aveeno are good options.  You can use that lotion a few times throughout the day as well.  If that is not helping at next visit we can discuss possible dermatology eval or other treatments.  I will check some labs today including a prostate test and other labs to look at other causes weight loss.  No change in medications for now.  Follow-up with me in 2 weeks and we can discuss these lab results and other testing further.  Please let me know if there are questions or any new symptoms in the meantime.  I appreciate you coming in today.  Hang in there.

## 2023-05-10 ENCOUNTER — Encounter: Payer: Self-pay | Admitting: Family Medicine

## 2023-05-15 ENCOUNTER — Telehealth: Payer: Self-pay

## 2023-05-15 NOTE — Telephone Encounter (Signed)
Good news.  Labs were overall normal.  Previous bilirubin elevation has normalized, liver tests look okay, electrolytes look okay.  Blood counts cholesterol and inflammation test also normal.  Prostate test and thyroid test were normal.  This is all great news.  I still would like to follow-up with you to discuss the weight loss further.  Keep appointment with me on February 6 but let me know if there are questions sooner.   Dr. Neva Seat

## 2023-05-18 NOTE — Telephone Encounter (Signed)
 Patient has been made aware.

## 2023-05-18 NOTE — Telephone Encounter (Signed)
Copied from CRM [id number]. Topic: Clinical - Lab/Test Results >> May 15, 2023  4:54 PM Sim Boast F wrote: Reason for CRM: Patient could not access message in MyChart regarding labs so I did read him the message from [facility] - patient request call back from [facility]

## 2023-05-21 ENCOUNTER — Encounter: Payer: Self-pay | Admitting: Family Medicine

## 2023-05-21 ENCOUNTER — Ambulatory Visit: Payer: PPO | Admitting: Family Medicine

## 2023-05-21 VITALS — BP 124/64 | HR 54 | Temp 98.5°F | Ht 71.5 in | Wt 141.4 lb

## 2023-05-21 DIAGNOSIS — L282 Other prurigo: Secondary | ICD-10-CM | POA: Diagnosis not present

## 2023-05-21 DIAGNOSIS — R634 Abnormal weight loss: Secondary | ICD-10-CM | POA: Diagnosis not present

## 2023-05-21 DIAGNOSIS — R252 Cramp and spasm: Secondary | ICD-10-CM

## 2023-05-21 DIAGNOSIS — R351 Nocturia: Secondary | ICD-10-CM | POA: Diagnosis not present

## 2023-05-21 MED ORDER — DOXAZOSIN MESYLATE 1 MG PO TABS: 1.0000 mg | ORAL_TABLET | Freq: Every day | ORAL | 0 refills | Status: DC

## 2023-05-21 NOTE — Patient Instructions (Addendum)
 I would try low dose doxazosin  if persistent nighttime wakening to urinate. If any new side effects let me know.  Watch for lightheadedness or dizziness and stop med if that occurs. Labs looked good last visit and weight is ok.  Follow up if any persistent cramps, over the counter magnesium is reasonable for now.  3 month recheck

## 2023-05-21 NOTE — Progress Notes (Signed)
 Subjective:  Patient ID: Christopher Hendricks, male    DOB: 1945-06-27  Age: 78 y.o. MRN: [id number]  CC:  Chief Complaint  Patient presents with   Medical Management of Chronic Issues    Here to for 2 wk rash- improved and wt loss f/u. Also, wants to review labs.    HPI Christopher Hendricks presents for    Pruritic rash Discussed that his January 24 visit.  Similar symptoms each winter, chest back arms and legs.  Possible dry skin component.  Hydrating soap discussed Eucerin or Aveeno lotion, rash has improved with warm weather and use of lotion. No genital or mouth rash.   Weight loss Discussed he had a normal TSH, CMP, sed rate, CBC and PSA at January 24 visit.  He denied night sweats or fever and was eating multiple meals per day at that time.  No stool changes, colonoscopy in 2023 with planned repeat in 3 years.  Persistent nocturia, 2-3 episodes per night.  Has declined urology referral previously.  Only 1 alcohol drink per day at last visit.  Weight has improved by 2 pounds since last visit. Had genetic testing - no specific concerns for heart disease or cancer.  Still with nocturia, 2-4 times per night, sometimes once. Still declines urology eval.  No stool changes. No abd pain, n/v.  Slow decline in weight past few decades. Denies abrupt changes. Declines further workup at this time.  Cramps at times - hands, feet. Cold weather made worse. Plans on magnesium supplement otc and follow up if not improved.     Wt Readings from Last 3 Encounters:  05/21/23 141 lb 6 oz (64.1 kg)  05/08/23 139 lb 9.6 oz (63.3 kg)  11/05/22 146 lb 12.8 oz (66.6 kg)    History Patient Active Problem List   Diagnosis Date Noted   Anemia 08/17/2012   Alcohol abuse 08/17/2012   Past Medical History:  Diagnosis Date   Alcoholism /alcohol abuse    Allergy    Arthritis    Blood transfusion without reported diagnosis    Duodenal ulcer    GERD (gastroesophageal reflux disease)    Heart murmur     as teen   Hyperlipidemia    Phreesia 03/18/2020   Neuromuscular disorder (HCC)    pinched nerve in neck that causes numbness in fingers on right hand    Right inguinal hernia 09/17/2018   Sigmoid diverticulosis 2007   Past Surgical History:  Procedure Laterality Date   COLONOSCOPY  2007   sigmoid diverticulosis.  Dr Aneita   ESOPHAGOGASTRODUODENOSCOPY N/A 08/18/2012   Procedure: ESOPHAGOGASTRODUODENOSCOPY (EGD);  Surgeon: Norleen LOISE Kiang, MD;  Location: [facility] ENDOSCOPY;  Service: Endoscopy;  Laterality: N/A;   HERNIA REPAIR     age 38   INGUINAL HERNIA REPAIR Right 09/17/2018   Procedure: LAPAROSCOPIC REPAIR RIGHT INGUINAL HERNIA;  Surgeon: Gail Favorite, MD;  Location: [facility];  Service: General;  Laterality: Right;   SMALL INTESTINE SURGERY     TONSILLECTOMY     age 50   UPPER GASTROINTESTINAL ENDOSCOPY     VASECTOMY     Allergies  Allergen Reactions   Codeine Nausea And Vomiting and Other (See Comments)   Sulfa Antibiotics Rash    Other reaction(s): Unknown   Prior to Admission medications   Medication Sig Start Date End Date Taking? Authorizing Provider  Acetaminophen  (TYLENOL  PO) Take by mouth at bedtime. 250mg    Yes [provider]  Ascorbic Acid (VITAMIN C) 100  MG tablet Take 100 mg by mouth daily. 2 times a week   Yes [provider]  atorvastatin  (LIPITOR) 10 MG tablet Take 1 tablet (10 mg total) by mouth daily. 05/08/23  Yes Levora Christopher SAUNDERS, MD  diphenhydrAMINE  (BENADRYL ) 25 MG tablet Take 25 mg by mouth at bedtime.    Yes [provider]  Glucosamine-Chondroit-Vit C-Mn (GLUCOSAMINE CHONDR 500 COMPLEX PO) Take 1 tablet by mouth daily.   Yes [provider]  Melatonin 5 MG TABS Take 10 mg by mouth at bedtime.   Yes [provider]  Multiple Vitamins-Minerals (MULTIVITAMIN PO) Take by mouth daily. 2 times a week   Yes [provider]  omeprazole (PRILOSEC) 20 MG capsule Take 20 mg by mouth daily.   Yes  [provider]  POTASSIUM GLUCONATE PO Take 550 mg by mouth every other day. 2 times a week   Yes [provider]  Zinc  30 MG TABS Take 50 mg by mouth every other day.    Yes [provider]   Social History   Socioeconomic History   Marital status: Married    Spouse name: Not on file   Number of children: 1   Years of education: Not on file   Highest education level: Master's degree (e.g., MA, MS, MEng, MEd, MSW, MBA)  Occupational History   Occupation: IT TRAINER  Tobacco Use   Smoking status: Never   Smokeless tobacco: Never  Vaping Use   Vaping status: Never Used  Substance and Sexual Activity   Alcohol use: Yes    Comment: 3-4 glasses wine a day    Drug use: Yes    Types: Marijuana    Comment: pt states he takes 1 puff a night- occasionally   Sexual activity: Not Currently    Comment: number of sex partners in the last 12 months  1 ([name])  Other Topics Concern   Not on file  Social History Narrative   Exercise walking 2 times a week for 1 mile   Married   CYTOGENETICIST   Social Drivers of Health   Financial Resource Strain: Low Risk  (08/20/2022)   Overall Financial Resource Strain (CARDIA)    Difficulty of Paying Living Expenses: Not very hard  Food Insecurity: No Food Insecurity (08/20/2022)   Hunger Vital Sign    Worried About Running Out of Food in the Last Year: Never true    Ran Out of Food in the Last Year: Never true  Transportation Needs: No Transportation Needs (08/20/2022)   PRAPARE - Administrator, Civil Service (Medical): No    Lack of Transportation (Non-Medical): No  Physical Activity: Sufficiently Active (08/20/2022)   Exercise Vital Sign    Days of Exercise per Week: 4 days    Minutes of Exercise per Session: 40 min  Stress: No Stress Concern Present (08/20/2022)   [employer] of Occupational Health - Occupational Stress Questionnaire    Feeling of Stress : Not at all  Social Connections: Moderately Isolated  (08/20/2022)   Social Connection and Isolation Panel [NHANES]    Frequency of Communication with Friends and Family: Three times a week    Frequency of Social Gatherings with Friends and Family: Three times a week    Attends Religious Services: Never    Active Member of Clubs or Organizations: No    Attends Banker Meetings: Never    Marital Status: Married  Catering Manager Violence: Not At Risk (08/20/2022)  Humiliation, Afraid, Rape, and Kick questionnaire    Fear of Current or Ex-Partner: No    Emotionally Abused: No    Physically Abused: No    Sexually Abused: No    Review of Systems   Objective:   Vitals:   05/21/23 0855  BP: 124/64  Pulse: (!) 54  Temp: 98.5 F (36.9 C)  TempSrc: Temporal  SpO2: 97%  Weight: 141 lb 6 oz (64.1 kg)  Height: 5' 11.5 (1.816 m)     Physical Exam Vitals reviewed.  Constitutional:      Appearance: He is well-developed.  HENT:     Head: Normocephalic and atraumatic.  Neck:     Vascular: No carotid bruit or JVD.  Cardiovascular:     Rate and Rhythm: Normal rate and regular rhythm.     Heart sounds: Normal heart sounds. No murmur heard. Pulmonary:     Effort: Pulmonary effort is normal.     Breath sounds: Normal breath sounds. No rales.  Abdominal:     General: There is no distension.     Palpations: There is no mass.     Tenderness: There is no abdominal tenderness.  Musculoskeletal:     Right lower leg: No edema.     Left lower leg: No edema.  Skin:    General: Skin is warm and dry.     Findings: No rash.  Neurological:     Mental Status: He is alert and oriented to person, place, and time.  Psychiatric:        Mood and Affect: Mood normal.        Assessment & Plan:  DAE HIGHLEY is a 79 y.o. male . Nocturia - Plan: doxazosin  (CARDURA ) 1 MG tablet  -Stable but persistent, PSA was reassuring.  Suspect component of BPH.  Has declined urology evaluation.  Did agree to try doxazosin  low-dose with  potential side effects and risks discussed, stop if those occur.  Recheck 3 months  Weight loss  -Weight up a few pounds from last visit.  Denies any acute changes, reports weight loss has occurred over years.  Labs reassuring last visit, and denies any new abdominal symptoms, nausea, vomiting or other concerns.  Will continue to monitor for now.  If any further decline in weight consider imaging, hematology/oncology eval. 30-month recheck, RTC precautions.  Pruritic rash  -Improved, continue hydrating lotion.  Muscle cramps  -Intermittent, various areas.  Plans to try over-the-counter magnesium supplement, declines labs today.  RTC precautions if persistent or worsening.  Previous labs reassuring.  Meds ordered this encounter  Medications   doxazosin  (CARDURA ) 1 MG tablet    Sig: Take 1 tablet (1 mg total) by mouth daily.    Dispense:  30 tablet    Refill:  0   Patient Instructions  I would try low dose doxazosin  if persistent nighttime wakening to urinate. If any new side effects let me know.  Watch for lightheadedness or dizziness and stop med if that occurs. Labs looked good last visit and weight is ok.  Follow up if any persistent cramps, over the counter magnesium is reasonable for now.  3 month recheck     Signed,   Christopher Pines, MD [facility], [facility] 05/21/23 9:48 AM

## 2023-06-13 ENCOUNTER — Other Ambulatory Visit: Payer: Self-pay | Admitting: Family Medicine

## 2023-06-13 DIAGNOSIS — R351 Nocturia: Secondary | ICD-10-CM

## 2023-07-13 ENCOUNTER — Other Ambulatory Visit: Payer: Self-pay | Admitting: Family Medicine

## 2023-07-13 DIAGNOSIS — R351 Nocturia: Secondary | ICD-10-CM

## 2023-08-15 ENCOUNTER — Other Ambulatory Visit: Payer: Self-pay | Admitting: Family Medicine

## 2023-08-15 DIAGNOSIS — R351 Nocturia: Secondary | ICD-10-CM

## 2023-08-21 ENCOUNTER — Ambulatory Visit (INDEPENDENT_AMBULATORY_CARE_PROVIDER_SITE_OTHER): Admitting: Student in an Organized Health Care Education/Training Program

## 2023-08-21 ENCOUNTER — Encounter: Payer: Self-pay | Admitting: Student in an Organized Health Care Education/Training Program

## 2023-08-21 DIAGNOSIS — R351 Nocturia: Secondary | ICD-10-CM

## 2023-08-21 MED ORDER — DOXAZOSIN MESYLATE 1 MG PO TABS: 1.0000 mg | ORAL_TABLET | Freq: Every day | ORAL | 1 refills | Status: DC

## 2023-08-21 NOTE — Progress Notes (Signed)
 Acute visit made in error, patient only requesting a refill of doxazosin  for nocturia.  Stared this medication 3 months ago, has found it effective, no side effects, wants to continue.  I provided a refill, no charge visit.

## 2023-10-19 ENCOUNTER — Telehealth: Payer: Self-pay

## 2023-10-19 DIAGNOSIS — L821 Other seborrheic keratosis: Secondary | ICD-10-CM | POA: Diagnosis not present

## 2023-10-19 DIAGNOSIS — L578 Other skin changes due to chronic exposure to nonionizing radiation: Secondary | ICD-10-CM | POA: Diagnosis not present

## 2023-10-19 DIAGNOSIS — L570 Actinic keratosis: Secondary | ICD-10-CM | POA: Diagnosis not present

## 2023-10-19 DIAGNOSIS — L820 Inflamed seborrheic keratosis: Secondary | ICD-10-CM | POA: Diagnosis not present

## 2023-10-19 DIAGNOSIS — L814 Other melanin hyperpigmentation: Secondary | ICD-10-CM | POA: Diagnosis not present

## 2023-10-19 DIAGNOSIS — D1801 Hemangioma of skin and subcutaneous tissue: Secondary | ICD-10-CM | POA: Diagnosis not present

## 2023-10-19 NOTE — Telephone Encounter (Signed)
 Copied from CRM [id number]. Topic: General - Call Back - No Documentation >> Oct 19, 2023  2:23 PM Berneda FALCON wrote: Reason for CRM: Pt states he got a missed call from us  but I do not see any documentation as to why he got the call so I am not positive. Please call the patient back if needed.  Pt callback is [phone]

## 2023-10-19 NOTE — Telephone Encounter (Signed)
No note documented.

## 2023-10-26 ENCOUNTER — Encounter (INDEPENDENT_AMBULATORY_CARE_PROVIDER_SITE_OTHER): Payer: Self-pay

## 2023-12-23 ENCOUNTER — Other Ambulatory Visit: Payer: Self-pay | Admitting: Family Medicine

## 2023-12-23 DIAGNOSIS — E785 Hyperlipidemia, unspecified: Secondary | ICD-10-CM

## 2024-02-15 ENCOUNTER — Ambulatory Visit (INDEPENDENT_AMBULATORY_CARE_PROVIDER_SITE_OTHER)

## 2024-02-15 ENCOUNTER — Other Ambulatory Visit: Payer: Self-pay | Admitting: Student in an Organized Health Care Education/Training Program

## 2024-02-15 VITALS — Ht 71.5 in | Wt 138.0 lb

## 2024-02-15 DIAGNOSIS — R351 Nocturia: Secondary | ICD-10-CM

## 2024-02-15 DIAGNOSIS — Z0000 Encounter for general adult medical examination without abnormal findings: Secondary | ICD-10-CM

## 2024-02-15 NOTE — Patient Instructions (Addendum)
 Mr. Kearse,  Thank you for taking the time for your Medicare Wellness Visit. I appreciate your continued commitment to your health goals. Please review the care plan we discussed, and feel free to reach out if I can assist you further.  Please note that Annual Wellness Visits do not include a physical exam. Some assessments may be limited, especially if the visit was conducted virtually. If needed, we may recommend an in-person follow-up with your provider.  Ongoing Care Seeing your primary care provider every 3 to 6 months helps us  monitor your health and provide consistent, personalized care. Last office visit on 05/21/2023.  Each day, aim for 6 glasses of water, plenty of protein in your diet and try to get up and walk/ stretch every hour for 5-10 minutes at a time.    Referrals If a referral was made during today's visit and you haven't received any updates within two weeks, please contact the referred provider directly to check on the status.  Recommended Screenings:  Health Maintenance  Topic Date Due   Zoster (Shingles) Vaccine (1 of 2) 03/08/1966   COVID-19 Vaccine (7 - 2025-26 season) 12/14/2023   Colon Cancer Screening  08/13/2024   Medicare Annual Wellness Visit  02/14/2025   DTaP/Tdap/Td vaccine (2 - Td or Tdap) 12/06/2031   Pneumococcal Vaccine for age over 61  Completed   Flu Shot  Completed   Hepatitis C Screening  Completed   Meningitis B Vaccine  Aged Out       02/11/2024    9:46 AM  Advanced Directives  Does Patient Have a Medical Advance Directive? Yes  Type of Estate Agent of [place];Living will;Out of facility DNR (pink MOST or yellow form)    Vision: Annual vision screenings are recommended for early detection of glaucoma, cataracts, and diabetic retinopathy. These exams can also reveal signs of chronic conditions such as diabetes and high blood pressure.  Dental: Annual dental screenings help detect early signs of oral cancer, gum  disease, and other conditions linked to overall health, including heart disease and diabetes.  Please see the attached documents for additional preventive care recommendations.

## 2024-02-15 NOTE — Progress Notes (Deleted)
 Subjective:   Christopher Hendricks is a 78 y.o. male who presents for a Medicare Annual Wellness Visit.  Allergies (verified) Codeine and Sulfa antibiotics   History: Past Medical History:  Diagnosis Date   Alcoholism /alcohol abuse    Allergy    Arthritis    Blood transfusion without reported diagnosis    Duodenal ulcer    GERD (gastroesophageal reflux disease)    Heart murmur    as teen   Hyperlipidemia    Phreesia 03/18/2020   Neuromuscular disorder (HCC)    pinched nerve in neck that causes numbness in fingers on right hand    Right inguinal hernia 09/17/2018   Sigmoid diverticulosis 2007   Past Surgical History:  Procedure Laterality Date   COLONOSCOPY  2007   sigmoid diverticulosis.  Dr Aneita   ESOPHAGOGASTRODUODENOSCOPY N/A 08/18/2012   Procedure: ESOPHAGOGASTRODUODENOSCOPY (EGD);  Surgeon: Norleen LOISE Kiang, MD;  Location: [facility] ENDOSCOPY;  Service: Endoscopy;  Laterality: N/A;   HERNIA REPAIR     age 58   INGUINAL HERNIA REPAIR Right 09/17/2018   Procedure: LAPAROSCOPIC REPAIR RIGHT INGUINAL HERNIA;  Surgeon: Gail Favorite, MD;  Location: [facility];  Service: General;  Laterality: Right;   SMALL INTESTINE SURGERY     TONSILLECTOMY     age 60   UPPER GASTROINTESTINAL ENDOSCOPY     VASECTOMY     Family History  Adopted: Yes  Problem Relation Age of Onset   Diabetes Daughter    Colon cancer Neg Hx    Social History   Occupational History   Occupation: IT TRAINER   Occupation: RETIRED  Tobacco Use   Smoking status: Never   Smokeless tobacco: Never  Vaping Use   Vaping status: Never Used  Substance and Sexual Activity   Alcohol use: Yes    Comment: 3-4 glasses wine a day    Drug use: Yes    Types: Marijuana    Comment: pt states he takes 1 puff a night- occasionally   Sexual activity: Not Currently    Comment: number of sex partners in the last 12 months  1 (Christopher Hendricks)   Tobacco Counseling Counseling given: Not Answered  SDOH Screenings   Food  Insecurity: No Food Insecurity (02/11/2024)  Housing: Low Risk  (02/11/2024)  Transportation Needs: No Transportation Needs (02/11/2024)  Utilities: Not At Risk (02/15/2024)  Alcohol Screen: Medium Risk (02/11/2024)  Depression (PHQ2-9): Low Risk  (02/15/2024)  Financial Resource Strain: Low Risk  (02/11/2024)  Physical Activity: Inactive (02/11/2024)  Social Connections: Moderately Isolated (02/11/2024)  Stress: Stress Concern Present (02/11/2024)  Tobacco Use: Low Risk  (02/15/2024)  Health Literacy: Adequate Health Literacy (02/15/2024)   Depression Screen    02/15/2024    4:02 PM 08/21/2023   10:59 AM 05/21/2023    9:23 AM 05/08/2023    8:23 AM 11/05/2022    1:39 PM 08/20/2022    9:57 AM 08/20/2022    9:56 AM  PHQ 2/9 Scores  PHQ - 2 Score 2 2  0 0 0 0  PHQ- 9 Score 2 5  3 2 1 1   Exception Documentation   Patient refusal         Goals Addressed             This Visit's Progress    Patient Stated   On track    Continue current lifestyle       Visit info / Clinical Intake: Medicare Wellness Visit Type:: Subsequent Annual Wellness Visit Medicare Wellness Visit Mode::  Telephone If telephone:: video declined If telephone or video:: unable to obtan vitals due to lack of equipment Interpreter Needed?: No Pre-visit prep was completed: yes AWV questionnaire completed by patient prior to visit?: yes Date:: 02/11/24 Living arrangements:: lives with spouse/significant other Patient's Overall Health Status Rating: good Typical amount of pain: some Does pain affect daily life?: no Are you currently prescribed opioids?: no  Dietary Habits and Nutritional Risks How many meals a day?: 3 Eats fruit and vegetables daily?: yes Most meals are obtained by: preparing own meals Diabetic:: no  Functional Status Activities of Daily Living (to include ambulation/medication): (Patient-Rptd) Independent Ambulation: (Patient-Rptd) Independent Medication Administration: Independent Home  Management: (Patient-Rptd) Independent Manage your own finances?: yes Primary transportation is: driving Concerns about vision?: no *vision screening is required for WTM* Concerns about hearing?: no  Fall Screening Falls in the past year?: 0 Number of falls in past year: (Patient-Rptd) 0 Was there an injury with Fall?: (Patient-Rptd) 0 Fall Risk Category Calculator: 0 Patient Fall Risk Level: Low Fall Risk  Fall Risk Patient at Risk for Falls Due to: No Fall Risks Fall risk Follow up: Falls evaluation completed; Falls prevention discussed  Home and Transportation Safety: All rugs have non-skid backing?: N/A, no rugs All stairs or steps have railings?: yes (steps in front) Grab bars in the bathtub or shower?: yes Have non-skid surface in bathtub or shower?: yes Good home lighting?: yes Regular seat belt use?: yes Hospital stays in the last year:: no  Cognitive Assessment Difficulty concentrating, remembering, or making decisions? : no Will 6CIT or Mini Cog be Completed: no 6CIT or Mini Cog Declined: patient alert, oriented, able to answer questions appropriately and recall recent events  Advance Directives (For Healthcare) Does Patient Have a Medical Advance Directive?: Yes Type of Advance Directive: Healthcare Power of [place]; Living will; Out of facility DNR (pink MOST or yellow form)  Reviewed/Updated  Reviewed/Updated: All        Objective:    Today's Vitals   02/15/24 1545  Weight: 138 lb (62.6 kg)  Height: 5' 11.5 (1.816 m)   Body mass index is 18.98 kg/m.  Current Medications (verified) Outpatient Encounter Medications as of 02/15/2024  Medication Sig   Acetaminophen  (TYLENOL  PO) Take by mouth at bedtime. 250mg    Ascorbic Acid (VITAMIN C) 100 MG tablet Take 100 mg by mouth daily. 2 times a week   atorvastatin  (LIPITOR) 10 MG tablet Take 1 tablet by mouth once daily   diphenhydrAMINE  (BENADRYL ) 25 MG tablet Take 25 mg by mouth at bedtime.    doxazosin   (CARDURA ) 1 MG tablet Take 1 tablet (1 mg total) by mouth daily.   Glucosamine-Chondroit-Vit C-Mn (GLUCOSAMINE CHONDR 500 COMPLEX PO) Take 1 tablet by mouth daily.   Melatonin 5 MG TABS Take 10 mg by mouth at bedtime.   Multiple Vitamins-Minerals (MULTIVITAMIN PO) Take by mouth daily. 2 times a week   omeprazole (PRILOSEC) 20 MG capsule Take 20 mg by mouth daily.   POTASSIUM GLUCONATE PO Take 550 mg by mouth every other day. 2 times a week   Zinc  30 MG TABS Take 50 mg by mouth every other day.    Facility-Administered Encounter Medications as of 02/15/2024  Medication   0.9 %  sodium chloride  infusion   Hearing/Vision screen Hearing Screening - Comments:: Denies hearing difficulties   Vision Screening - Comments:: Wears eyeglasses/Dr. Roth/per pt -up to date Immunizations and Health Maintenance Health Maintenance  Topic Date Due   Zoster Vaccines- Shingrix (1 of  2) 03/08/1966   Medicare Annual Wellness (AWV)  08/20/2023   COVID-19 Vaccine (7 - 2025-26 season) 12/14/2023   Colonoscopy  08/13/2024   DTaP/Tdap/Td (2 - Td or Tdap) 12/06/2031   Pneumococcal Vaccine: 50+ Years  Completed   Influenza Vaccine  Completed   Hepatitis C Screening  Completed   Meningococcal B Vaccine  Aged Out        Assessment/Plan:  This is a routine wellness examination for [place].  Patient Care Team: Levora Reyes SAUNDERS, MD as PCP - General (Family Medicine) Aneita Gwendlyn DASEN, MD (Inactive) as Consulting Physician (Gastroenterology) Lynnell Nottingham, MD as Consulting Physician (Dermatology) Madelyn Deanne BRAVO, OD (Optometry)  I have personally reviewed and noted the following in the patient's chart:   Medical and social history Use of alcohol, tobacco or illicit drugs  Current medications and supplements including opioid prescriptions. Functional ability and status Nutritional status Physical activity Advanced directives List of other physicians Hospitalizations, surgeries, and ER visits in  previous 12 months Vitals Screenings to include cognitive, depression, and falls Referrals and appointments  No orders of the defined types were placed in this encounter.  In addition, I have reviewed and discussed with patient certain preventive protocols, quality metrics, and best practice recommendations. A written personalized care plan for preventive services as well as general preventive health recommendations were provided to patient.   Desiraye Rolfson L Olar Santini, CMA   02/15/2024   No follow-ups on file.  After Visit Summary: (MyChart) Due to this being a telephonic visit, the after visit summary with patients personalized plan was offered to patient via MyChart   Nurse Notes: Patient is due for a Shingrix vaccine.  Patient stated that he would call the office to schedule his yearly soon.  He had no other concerns to address today.

## 2024-02-25 NOTE — Progress Notes (Signed)
 Subjective:   Christopher Hendricks is a 78 y.o. male who presents for a Medicare Annual Wellness Visit.   I connected with Christopher Hendricks on 111/06/2023 by a audio enabled telemedicine application and verified that I am speaking with the correct person using two identifiers.  Patient Location: Home  Provider Location: Home Office  Persons Participating in Visit: Patient.  I discussed the limitations of evaluation and management by telemedicine. The patient expressed understanding and agreed to proceed.  Vital Signs: Because this visit was a virtual/telehealth visit, some criteria may be missing or patient reported. Any vitals not documented were not able to be obtained and vitals that have been documented are patient reported.  Allergies (verified) Codeine and Sulfa antibiotics   History: Past Medical History:  Diagnosis Date   Alcoholism /alcohol abuse    Allergy    Arthritis    Blood transfusion without reported diagnosis    Duodenal ulcer    GERD (gastroesophageal reflux disease)    Heart murmur    as teen   Hyperlipidemia    Phreesia 03/18/2020   Neuromuscular disorder (HCC)    pinched nerve in neck that causes numbness in fingers on right hand    Right inguinal hernia 09/17/2018   Sigmoid diverticulosis 2007   Past Surgical History:  Procedure Laterality Date   COLONOSCOPY  2007   sigmoid diverticulosis.  Dr Aneita   ESOPHAGOGASTRODUODENOSCOPY N/A 08/18/2012   Procedure: ESOPHAGOGASTRODUODENOSCOPY (EGD);  Surgeon: Norleen LOISE Kiang, MD;  Location: [facility] ENDOSCOPY;  Service: Endoscopy;  Laterality: N/A;   HERNIA REPAIR     age 46   INGUINAL HERNIA REPAIR Right 09/17/2018   Procedure: LAPAROSCOPIC REPAIR RIGHT INGUINAL HERNIA;  Surgeon: Gail Favorite, MD;  Location: [facility];  Service: General;  Laterality: Right;   SMALL INTESTINE SURGERY     TONSILLECTOMY     age 73   UPPER GASTROINTESTINAL ENDOSCOPY     VASECTOMY     Family History  Adopted: Yes   Problem Relation Age of Onset   Diabetes Daughter    Colon cancer Neg Hx    Social History   Occupational History   Occupation: IT TRAINER   Occupation: RETIRED  Tobacco Use   Smoking status: Never   Smokeless tobacco: Never  Vaping Use   Vaping status: Never Used  Substance and Sexual Activity   Alcohol use: Yes    Comment: 3-4 glasses wine a day    Drug use: Yes    Types: Marijuana    Comment: pt states he takes 1 puff a night- occasionally   Sexual activity: Not Currently    Comment: number of sex partners in the last 12 months  1 (Christopher Hendricks)   Tobacco Counseling Counseling given: Not Answered  SDOH Screenings   Food Insecurity: No Food Insecurity (02/11/2024)  Housing: Low Risk  (02/11/2024)  Transportation Needs: No Transportation Needs (02/11/2024)  Utilities: Not At Risk (02/15/2024)  Alcohol Screen: Medium Risk (02/11/2024)  Depression (PHQ2-9): Low Risk  (02/15/2024)  Financial Resource Strain: Low Risk  (02/11/2024)  Physical Activity: Inactive (02/11/2024)  Social Connections: Moderately Isolated (02/11/2024)  Stress: Stress Concern Present (02/11/2024)  Tobacco Use: Low Risk  (02/15/2024)  Health Literacy: Adequate Health Literacy (02/15/2024)   Depression Screen    02/15/2024    4:02 PM 08/21/2023   10:59 AM 05/21/2023    9:23 AM 05/08/2023    8:23 AM 11/05/2022    1:39 PM 08/20/2022    9:57 AM 08/20/2022  9:56 AM  PHQ 2/9 Scores  PHQ - 2 Score 2 2  0 0 0 0  PHQ- 9 Score 2  5   3  2  1  1    Exception Documentation   Patient refusal         Data saved with a previous flowsheet row definition     Goals Addressed             This Visit's Progress    Patient Stated   On track    Continue current lifestyle       Visit info / Clinical Intake: Medicare Wellness Visit Type:: Subsequent Annual Wellness Visit Medicare Wellness Visit Mode:: Telephone If telephone:: video declined Because this visit was a virtual/telehealth visit:: unable to obtan vitals due  to lack of equipment Interpreter Needed?: No Pre-visit prep was completed: yes AWV questionnaire completed by patient prior to visit?: yes Date:: 02/11/24 Living arrangements:: lives with spouse/significant other Patient's Overall Health Status Rating: good Typical amount of pain: some Does pain affect daily life?: no Are you currently prescribed opioids?: no  Dietary Habits and Nutritional Risks How many meals a day?: 3 Eats fruit and vegetables daily?: yes Most meals are obtained by: preparing own meals Diabetic:: no  Functional Status Activities of Daily Living (to include ambulation/medication): (Patient-Rptd) Independent Ambulation: (Patient-Rptd) Independent Medication Administration: Independent Home Management: (Patient-Rptd) Independent Manage your own finances?: yes Primary transportation is: driving Concerns about vision?: no *vision screening is required for WTM* Concerns about hearing?: no  Fall Screening Falls in the past year?: 0 Number of falls in past year: (Patient-Rptd) 0 Was there an injury with Fall?: (Patient-Rptd) 0 Fall Risk Category Calculator: 0 Patient Fall Risk Level: Low Fall Risk  Fall Risk Patient at Risk for Falls Due to: No Fall Risks Fall risk Follow up: Falls evaluation completed; Falls prevention discussed  Home and Transportation Safety: All rugs have non-skid backing?: N/A, no rugs All stairs or steps have railings?: yes (steps in front) Grab bars in the bathtub or shower?: yes Have non-skid surface in bathtub or shower?: yes Good home lighting?: yes Regular seat belt use?: yes Hospital stays in the last year:: no  Cognitive Assessment Difficulty concentrating, remembering, or making decisions? : no Will 6CIT or Mini Cog be Completed: no 6CIT or Mini Cog Declined: patient alert, oriented, able to answer questions appropriately and recall recent events  Advance Directives (For Healthcare) Does Patient Have a Medical Advance  Directive?: Yes Type of Advance Directive: Healthcare Power of [place]; Living will; Out of facility DNR (pink MOST or yellow form)  Reviewed/Updated  Reviewed/Updated: All        Objective:    Today's Vitals   02/15/24 1545  Weight: 138 lb (62.6 kg)  Height: 5' 11.5 (1.816 m)   Body mass index is 18.98 kg/m.  Current Medications (verified) Outpatient Encounter Medications as of 02/15/2024  Medication Sig   Acetaminophen  (TYLENOL  PO) Take by mouth at bedtime. 250mg    Ascorbic Acid (VITAMIN C) 100 MG tablet Take 100 mg by mouth daily. 2 times a week   atorvastatin  (LIPITOR) 10 MG tablet Take 1 tablet by mouth once daily   diphenhydrAMINE  (BENADRYL ) 25 MG tablet Take 25 mg by mouth at bedtime.    Glucosamine-Chondroit-Vit C-Mn (GLUCOSAMINE CHONDR 500 COMPLEX PO) Take 1 tablet by mouth daily.   Melatonin 5 MG TABS Take 10 mg by mouth at bedtime.   Multiple Vitamins-Minerals (MULTIVITAMIN PO) Take by mouth daily. 2 times a week  omeprazole (PRILOSEC) 20 MG capsule Take 20 mg by mouth daily.   POTASSIUM GLUCONATE PO Take 550 mg by mouth every other day. 2 times a week   Zinc  30 MG TABS Take 50 mg by mouth every other day.    [DISCONTINUED] doxazosin  (CARDURA ) 1 MG tablet Take 1 tablet (1 mg total) by mouth daily.   Facility-Administered Encounter Medications as of 02/15/2024  Medication   0.9 %  sodium chloride  infusion   Hearing/Vision screen Hearing Screening - Comments:: Denies hearing difficulties   Vision Screening - Comments:: Wears eyeglasses/Dr. Roth/per pt -up to date Immunizations and Health Maintenance Health Maintenance  Topic Date Due   Zoster Vaccines- Shingrix (1 of 2) 03/08/1966   COVID-19 Vaccine (7 - 2025-26 season) 12/14/2023   Colonoscopy  08/13/2024   Medicare Annual Wellness (AWV)  02/14/2025   DTaP/Tdap/Td (2 - Td or Tdap) 12/06/2031   Pneumococcal Vaccine: 50+ Years  Completed   Influenza Vaccine  Completed   Hepatitis C Screening  Completed    Meningococcal B Vaccine  Aged Out        Assessment/Plan:  This is a routine wellness examination for [place].  Patient Care Team: Levora Reyes SAUNDERS, MD as PCP - General (Family Medicine) Aneita Gwendlyn DASEN, MD (Inactive) as Consulting Physician (Gastroenterology) Lynnell Nottingham, MD as Consulting Physician (Dermatology) Madelyn Deanne BRAVO, OD (Optometry)  I have personally reviewed and noted the following in the patient's chart:   Medical and social history Use of alcohol, tobacco or illicit drugs  Current medications and supplements including opioid prescriptions. Functional ability and status Nutritional status Physical activity Advanced directives List of other physicians Hospitalizations, surgeries, and ER visits in previous 12 months Vitals Screenings to include cognitive, depression, and falls Referrals and appointments  No orders of the defined types were placed in this encounter.  In addition, I have reviewed and discussed with patient certain preventive protocols, quality metrics, and best practice recommendations. A written personalized care plan for preventive services as well as general preventive health recommendations were provided to patient.   Caleb Decock L Kesha Hurrell, CMA   02/15/2024   Return in 1 year (on 02/14/2025).  After Visit Summary: (MyChart) Due to this being a telephonic visit, the after visit summary with patients personalized plan was offered to patient via MyChart   Nurse Notes: Patient is due for a Shingrix vaccine.  Patient stated that he would call the office to schedule his yearly soon.  He had no other concerns to address today.

## 2024-02-25 NOTE — Progress Notes (Deleted)
 Subjective:   Christopher Hendricks is a 78 y.o. male who presents for a Medicare Annual Wellness Visit.   I connected with Christopher Hendricks Check on 02/25/24 by a audio enabled telemedicine application and verified that I am speaking with the correct person using two identifiers.  Patient Location: Home  Provider Location: Home Office  Persons Participating in Visit: Patient.  I discussed the limitations of evaluation and management by telemedicine. The patient expressed understanding and agreed to proceed.  Vital Signs: Because this visit was a virtual/telehealth visit, some criteria may be missing or patient reported. Any vitals not documented were not able to be obtained and vitals that have been documented are patient reported.  Allergies (verified) Codeine and Sulfa antibiotics   History: Past Medical History:  Diagnosis Date   Alcoholism /alcohol abuse    Allergy    Arthritis    Blood transfusion without reported diagnosis    Duodenal ulcer    GERD (gastroesophageal reflux disease)    Heart murmur    as teen   Hyperlipidemia    Phreesia 03/18/2020   Neuromuscular disorder (HCC)    pinched nerve in neck that causes numbness in fingers on right hand    Right inguinal hernia 09/17/2018   Sigmoid diverticulosis 2007   Past Surgical History:  Procedure Laterality Date   COLONOSCOPY  2007   sigmoid diverticulosis.  Dr Aneita   ESOPHAGOGASTRODUODENOSCOPY N/A 08/18/2012   Procedure: ESOPHAGOGASTRODUODENOSCOPY (EGD);  Surgeon: Norleen LOISE Kiang, MD;  Location: [facility] ENDOSCOPY;  Service: Endoscopy;  Laterality: N/A;   HERNIA REPAIR     age 45   INGUINAL HERNIA REPAIR Right 09/17/2018   Procedure: LAPAROSCOPIC REPAIR RIGHT INGUINAL HERNIA;  Surgeon: Gail Favorite, MD;  Location: [facility];  Service: General;  Laterality: Right;   SMALL INTESTINE SURGERY     TONSILLECTOMY     age 35   UPPER GASTROINTESTINAL ENDOSCOPY     VASECTOMY     Family History  Adopted: Yes   Problem Relation Age of Onset   Diabetes Daughter    Colon cancer Neg Hx    Social History   Occupational History   Occupation: IT TRAINER   Occupation: RETIRED  Tobacco Use   Smoking status: Never   Smokeless tobacco: Never  Vaping Use   Vaping status: Never Used  Substance and Sexual Activity   Alcohol use: Yes    Comment: 3-4 glasses wine a day    Drug use: Yes    Types: Marijuana    Comment: pt states he takes 1 puff a night- occasionally   Sexual activity: Not Currently    Comment: number of sex partners in the last 12 months  1 ([name])   Tobacco Counseling Counseling given: Not Answered  SDOH Screenings   Food Insecurity: No Food Insecurity (02/11/2024)  Housing: Low Risk  (02/11/2024)  Transportation Needs: No Transportation Needs (02/11/2024)  Utilities: Not At Risk (02/15/2024)  Alcohol Screen: Medium Risk (02/11/2024)  Depression (PHQ2-9): Low Risk  (02/15/2024)  Financial Resource Strain: Low Risk  (02/11/2024)  Physical Activity: Inactive (02/11/2024)  Social Connections: Moderately Isolated (02/11/2024)  Stress: Stress Concern Present (02/11/2024)  Tobacco Use: Low Risk  (02/15/2024)  Health Literacy: Adequate Health Literacy (02/15/2024)   Depression Screen    02/15/2024    4:02 PM 08/21/2023   10:59 AM 05/21/2023    9:23 AM 05/08/2023    8:23 AM 11/05/2022    1:39 PM 08/20/2022    9:57 AM 08/20/2022  9:56 AM  PHQ 2/9 Scores  PHQ - 2 Score 2 2  0 0 0 0  PHQ- 9 Score 2  5   3  2  1  1    Exception Documentation   Patient refusal         Data saved with a previous flowsheet row definition     Goals Addressed             This Visit's Progress    Patient Stated   On track    Continue current lifestyle       Visit info / Clinical Intake: Medicare Wellness Visit Type:: Subsequent Annual Wellness Visit Medicare Wellness Visit Mode:: Telephone If telephone:: video declined Because this visit was a virtual/telehealth visit:: unable to obtan vitals due  to lack of equipment Interpreter Needed?: No Pre-visit prep was completed: yes AWV questionnaire completed by patient prior to visit?: yes Date:: 02/11/24 Living arrangements:: lives with spouse/significant other Patient's Overall Health Status Rating: good Typical amount of pain: some Does pain affect daily life?: no Are you currently prescribed opioids?: no  Dietary Habits and Nutritional Risks How many meals a day?: 3 Eats fruit and vegetables daily?: yes Most meals are obtained by: preparing own meals Diabetic:: no  Functional Status Activities of Daily Living (to include ambulation/medication): (Patient-Rptd) Independent Ambulation: (Patient-Rptd) Independent Medication Administration: Independent Home Management: (Patient-Rptd) Independent Manage your own finances?: yes Primary transportation is: driving Concerns about vision?: no *vision screening is required for WTM* Concerns about hearing?: no  Fall Screening Falls in the past year?: 0 Number of falls in past year: (Patient-Rptd) 0 Was there an injury with Fall?: (Patient-Rptd) 0 Fall Risk Category Calculator: 0 Patient Fall Risk Level: Low Fall Risk  Fall Risk Patient at Risk for Falls Due to: No Fall Risks Fall risk Follow up: Falls evaluation completed; Falls prevention discussed  Home and Transportation Safety: All rugs have non-skid backing?: N/A, no rugs All stairs or steps have railings?: yes (steps in front) Grab bars in the bathtub or shower?: yes Have non-skid surface in bathtub or shower?: yes Good home lighting?: yes Regular seat belt use?: yes Hospital stays in the last year:: no  Cognitive Assessment Difficulty concentrating, remembering, or making decisions? : no Will 6CIT or Mini Cog be Completed: no 6CIT or Mini Cog Declined: patient alert, oriented, able to answer questions appropriately and recall recent events  Advance Directives (For Healthcare) Does Patient Have a Medical Advance  Directive?: Yes Type of Advance Directive: Healthcare Power of [place]; Living will; Out of facility DNR (pink MOST or yellow form)  Reviewed/Updated  Reviewed/Updated: All        Objective:    Today's Vitals   02/15/24 1545  Weight: 138 lb (62.6 kg)  Height: 5' 11.5 (1.816 m)   Body mass index is 18.98 kg/m.  Current Medications (verified) Outpatient Encounter Medications as of 02/15/2024  Medication Sig   Acetaminophen  (TYLENOL  PO) Take by mouth at bedtime. 250mg    Ascorbic Acid (VITAMIN C) 100 MG tablet Take 100 mg by mouth daily. 2 times a week   atorvastatin  (LIPITOR) 10 MG tablet Take 1 tablet by mouth once daily   diphenhydrAMINE  (BENADRYL ) 25 MG tablet Take 25 mg by mouth at bedtime.    Glucosamine-Chondroit-Vit C-Mn (GLUCOSAMINE CHONDR 500 COMPLEX PO) Take 1 tablet by mouth daily.   Melatonin 5 MG TABS Take 10 mg by mouth at bedtime.   Multiple Vitamins-Minerals (MULTIVITAMIN PO) Take by mouth daily. 2 times a week  omeprazole (PRILOSEC) 20 MG capsule Take 20 mg by mouth daily.   POTASSIUM GLUCONATE PO Take 550 mg by mouth every other day. 2 times a week   Zinc  30 MG TABS Take 50 mg by mouth every other day.    [DISCONTINUED] doxazosin  (CARDURA ) 1 MG tablet Take 1 tablet (1 mg total) by mouth daily.   Facility-Administered Encounter Medications as of 02/15/2024  Medication   0.9 %  sodium chloride  infusion   Hearing/Vision screen Hearing Screening - Comments:: Denies hearing difficulties   Vision Screening - Comments:: Wears eyeglasses/Dr. Roth/per pt -up to date Immunizations and Health Maintenance Health Maintenance  Topic Date Due   Zoster Vaccines- Shingrix (1 of 2) 03/08/1966   COVID-19 Vaccine (7 - 2025-26 season) 12/14/2023   Colonoscopy  08/13/2024   Medicare Annual Wellness (AWV)  02/14/2025   DTaP/Tdap/Td (2 - Td or Tdap) 12/06/2031   Pneumococcal Vaccine: 50+ Years  Completed   Influenza Vaccine  Completed   Hepatitis C Screening  Completed    Meningococcal B Vaccine  Aged Out        Assessment/Plan:  This is a routine wellness examination for [place].  Patient Care Team: Levora Reyes SAUNDERS, MD as PCP - General (Family Medicine) Aneita Gwendlyn DASEN, MD (Inactive) as Consulting Physician (Gastroenterology) Lynnell Nottingham, MD as Consulting Physician (Dermatology) Madelyn Deanne BRAVO, OD (Optometry)  I have personally reviewed and noted the following in the patient's chart:   Medical and social history Use of alcohol, tobacco or illicit drugs  Current medications and supplements including opioid prescriptions. Functional ability and status Nutritional status Physical activity Advanced directives List of other physicians Hospitalizations, surgeries, and ER visits in previous 12 months Vitals Screenings to include cognitive, depression, and falls Referrals and appointments  No orders of the defined types were placed in this encounter.  In addition, I have reviewed and discussed with patient certain preventive protocols, quality metrics, and best practice recommendations. A written personalized care plan for preventive services as well as general preventive health recommendations were provided to patient.   Christopher Hendricks L Davarius Ridener, CMA   02/15/2024   Return in 1 year (on 02/14/2025).  After Visit Summary: (MyChart) Due to this being a telephonic visit, the after visit summary with patients personalized plan was offered to patient via MyChart   Nurse Notes: Patient is due for a Shingrix vaccine.  Patient stated that he would call the office to schedule his yearly soon.  He had no other concerns to address today.

## 2024-03-25 ENCOUNTER — Other Ambulatory Visit: Payer: Self-pay | Admitting: Family Medicine

## 2024-03-25 DIAGNOSIS — E785 Hyperlipidemia, unspecified: Secondary | ICD-10-CM

## 2024-05-14 ENCOUNTER — Other Ambulatory Visit: Payer: Self-pay | Admitting: Family Medicine

## 2024-05-14 DIAGNOSIS — R351 Nocturia: Secondary | ICD-10-CM
